# Patient Record
Sex: Male | Born: 1935 | Race: White | Hispanic: No | Marital: Married | Smoking: Former smoker
Health system: Southern US, Community
[De-identification: ages and names within clinical notes are randomized; demographics above are authoritative.]

## PROBLEM LIST (undated history)

## (undated) DIAGNOSIS — I10 Essential (primary) hypertension: Secondary | ICD-10-CM

## (undated) DIAGNOSIS — Z87442 Personal history of urinary calculi: Secondary | ICD-10-CM

## (undated) DIAGNOSIS — M1990 Unspecified osteoarthritis, unspecified site: Secondary | ICD-10-CM

## (undated) DIAGNOSIS — E119 Type 2 diabetes mellitus without complications: Secondary | ICD-10-CM

## (undated) DIAGNOSIS — K219 Gastro-esophageal reflux disease without esophagitis: Secondary | ICD-10-CM

## (undated) DIAGNOSIS — I4891 Unspecified atrial fibrillation: Secondary | ICD-10-CM

## (undated) DIAGNOSIS — E785 Hyperlipidemia, unspecified: Secondary | ICD-10-CM

## (undated) DIAGNOSIS — Q249 Congenital malformation of heart, unspecified: Secondary | ICD-10-CM

## (undated) DIAGNOSIS — K635 Polyp of colon: Secondary | ICD-10-CM

## (undated) DIAGNOSIS — N200 Calculus of kidney: Secondary | ICD-10-CM

## (undated) HISTORY — DX: Type 2 diabetes mellitus without complications: E11.9

## (undated) HISTORY — DX: Gastro-esophageal reflux disease without esophagitis: K21.9

## (undated) HISTORY — DX: Essential (primary) hypertension: I10

## (undated) HISTORY — DX: Polyp of colon: K63.5

## (undated) HISTORY — PX: OTHER SURGICAL HISTORY: SHX169

## (undated) HISTORY — DX: Hyperlipidemia, unspecified: E78.5

## (undated) HISTORY — DX: Unspecified atrial fibrillation: I48.91

## (undated) HISTORY — DX: Calculus of kidney: N20.0

## (undated) HISTORY — DX: Congenital malformation of heart, unspecified: Q24.9

---

## 2006-08-02 ENCOUNTER — Ambulatory Visit: Payer: Self-pay | Admitting: Internal Medicine

## 2006-08-02 LAB — CONVERTED CEMR LAB
BUN: 15 mg/dL (ref 6–23)
Calcium: 9.3 mg/dL (ref 8.4–10.5)
GFR calc Af Amer: 171 mL/min
GFR calc non Af Amer: 141 mL/min
Hgb A1c MFr Bld: 7.2 % — ABNORMAL HIGH (ref 4.6–6.0)
LDL Cholesterol: 69 mg/dL (ref 0–99)
Potassium: 4 meq/L (ref 3.5–5.1)
Total CHOL/HDL Ratio: 2.4
Triglycerides: 62 mg/dL (ref 0–149)
VLDL: 12 mg/dL (ref 0–40)

## 2006-11-09 ENCOUNTER — Ambulatory Visit: Payer: Self-pay | Admitting: Internal Medicine

## 2006-11-09 LAB — CONVERTED CEMR LAB
Calcium: 9 mg/dL (ref 8.4–10.5)
Chloride: 106 meq/L (ref 96–112)
Cholesterol: 144 mg/dL (ref 0–200)
Creatinine, Ser: 0.6 mg/dL (ref 0.4–1.5)
GFR calc non Af Amer: 141 mL/min
Glucose, Bld: 128 mg/dL — ABNORMAL HIGH (ref 70–99)
HDL: 61.1 mg/dL (ref >39.0)
LDL Cholesterol: 68 mg/dL (ref 0–99)
Sodium: 141 meq/L (ref 135–145)

## 2006-11-11 ENCOUNTER — Encounter: Payer: Self-pay | Admitting: Internal Medicine

## 2006-11-11 DIAGNOSIS — J309 Allergic rhinitis, unspecified: Secondary | ICD-10-CM | POA: Insufficient documentation

## 2006-11-11 DIAGNOSIS — I10 Essential (primary) hypertension: Secondary | ICD-10-CM | POA: Insufficient documentation

## 2006-11-11 DIAGNOSIS — Z9189 Other specified personal risk factors, not elsewhere classified: Secondary | ICD-10-CM | POA: Insufficient documentation

## 2006-11-11 DIAGNOSIS — E785 Hyperlipidemia, unspecified: Secondary | ICD-10-CM | POA: Insufficient documentation

## 2006-11-11 DIAGNOSIS — N201 Calculus of ureter: Secondary | ICD-10-CM | POA: Insufficient documentation

## 2006-11-11 DIAGNOSIS — K219 Gastro-esophageal reflux disease without esophagitis: Secondary | ICD-10-CM | POA: Insufficient documentation

## 2006-11-11 DIAGNOSIS — E119 Type 2 diabetes mellitus without complications: Secondary | ICD-10-CM | POA: Insufficient documentation

## 2007-01-21 ENCOUNTER — Ambulatory Visit: Payer: Self-pay | Admitting: Internal Medicine

## 2007-01-21 DIAGNOSIS — J019 Acute sinusitis, unspecified: Secondary | ICD-10-CM | POA: Insufficient documentation

## 2008-11-30 ENCOUNTER — Encounter (INDEPENDENT_AMBULATORY_CARE_PROVIDER_SITE_OTHER): Payer: Self-pay | Admitting: *Deleted

## 2008-11-30 ENCOUNTER — Ambulatory Visit: Payer: Self-pay | Admitting: Gastroenterology

## 2008-11-30 DIAGNOSIS — Z860100 Personal history of colon polyps, unspecified: Secondary | ICD-10-CM | POA: Insufficient documentation

## 2008-11-30 DIAGNOSIS — K921 Melena: Secondary | ICD-10-CM | POA: Insufficient documentation

## 2008-12-22 ENCOUNTER — Ambulatory Visit: Payer: Self-pay | Admitting: Gastroenterology

## 2008-12-22 ENCOUNTER — Encounter: Payer: Self-pay | Admitting: Gastroenterology

## 2008-12-25 ENCOUNTER — Encounter: Payer: Self-pay | Admitting: Gastroenterology

## 2010-03-06 ENCOUNTER — Encounter: Payer: Self-pay | Admitting: Urology

## 2010-05-18 LAB — GLUCOSE, CAPILLARY
Glucose-Capillary: 100 mg/dL — ABNORMAL HIGH (ref 70–99)
Glucose-Capillary: 105 mg/dL — ABNORMAL HIGH (ref 70–99)

## 2010-08-01 ENCOUNTER — Encounter: Payer: Self-pay | Admitting: Physician Assistant

## 2010-08-02 ENCOUNTER — Encounter: Payer: Self-pay | Admitting: Physician Assistant

## 2010-08-02 ENCOUNTER — Ambulatory Visit (INDEPENDENT_AMBULATORY_CARE_PROVIDER_SITE_OTHER): Payer: Self-pay | Admitting: Physician Assistant

## 2010-08-02 DIAGNOSIS — R079 Chest pain, unspecified: Secondary | ICD-10-CM

## 2010-08-02 NOTE — Progress Notes (Signed)
Exercise Treadmill Test  Pre-Exercise Testing Evaluation Rhythm: normal sinus  Rate: 66   PR:  .19 QRS:  .09  QT:  .41 QTc: .43     Test  Exercise Tolerance Test Ordering MD: Jarome Matin M.D  Interpreting MD:  Tereso Newcomer PA-C  Unique Test No: 1  Treadmill:  1  Indication for ETT: chest pain - rule out ischemia  Contraindication to ETT: No   Stress Modality: exercise - treadmill  Cardiac Imaging Performed: non   Protocol: standard Bruce - maximal  Max BP: 205/96  Max MPHR (bpm):  145 85% MPR (bpm):  123  MPHR obtained (bpm):144 % MPHR obtained:  99%  Reached 85% MPHR (min:sec): 4:30 Total Exercise Time (min-sec):  6:30  Workload in METS:  10.9 Borg Scale: 18  Reason ETT Terminated:  patient's desire to stop    ST Segment Analysis At Rest: normal ST segments - no evidence of significant ST depression With Exercise: non-specific ST changes  Other Information Arrhythmia:  No Angina during ETT:  absent (0) Quality of ETT:  diagnostic  ETT Interpretation:  normal - no evidence of ischemia by ST analysis  Comments: Good exercise tolerance. Normal BP response to exercise. No chest pain. No ST-T changes to suggest ischemia.  Recommendations: Follow up with Dr. Eloise Harman.

## 2012-02-26 ENCOUNTER — Ambulatory Visit: Payer: Medicare Other | Attending: Orthopedic Surgery | Admitting: Physical Therapy

## 2012-02-26 ENCOUNTER — Ambulatory Visit: Payer: Self-pay | Admitting: Physical Therapy

## 2012-02-26 DIAGNOSIS — M545 Low back pain, unspecified: Secondary | ICD-10-CM | POA: Insufficient documentation

## 2012-02-26 DIAGNOSIS — IMO0001 Reserved for inherently not codable concepts without codable children: Secondary | ICD-10-CM | POA: Insufficient documentation

## 2012-02-26 DIAGNOSIS — M25559 Pain in unspecified hip: Secondary | ICD-10-CM | POA: Insufficient documentation

## 2012-02-26 DIAGNOSIS — M2569 Stiffness of other specified joint, not elsewhere classified: Secondary | ICD-10-CM | POA: Insufficient documentation

## 2012-02-29 ENCOUNTER — Ambulatory Visit: Payer: Medicare Other | Admitting: Physical Therapy

## 2012-03-04 ENCOUNTER — Ambulatory Visit: Payer: Medicare Other | Admitting: Physical Therapy

## 2012-03-07 ENCOUNTER — Ambulatory Visit: Payer: Medicare Other | Admitting: Physical Therapy

## 2012-03-11 ENCOUNTER — Ambulatory Visit: Payer: Medicare Other | Admitting: Physical Therapy

## 2012-03-14 ENCOUNTER — Ambulatory Visit: Payer: Medicare Other | Admitting: Physical Therapy

## 2012-07-10 ENCOUNTER — Other Ambulatory Visit: Payer: Self-pay | Admitting: Urology

## 2012-07-12 ENCOUNTER — Encounter (HOSPITAL_COMMUNITY): Payer: Self-pay | Admitting: Pharmacy Technician

## 2012-07-24 ENCOUNTER — Encounter (HOSPITAL_COMMUNITY)
Admission: RE | Admit: 2012-07-24 | Discharge: 2012-07-24 | Disposition: A | Discharge status: Home / Self Care | Payer: Medicare Other | Source: Ambulatory Visit | Attending: Urology | Admitting: Urology

## 2012-07-24 ENCOUNTER — Encounter (HOSPITAL_COMMUNITY): Payer: Self-pay

## 2012-07-24 ENCOUNTER — Ambulatory Visit (HOSPITAL_COMMUNITY)
Admission: RE | Admit: 2012-07-24 | Discharge: 2012-07-24 | Disposition: A | Discharge status: Home / Self Care | Payer: Medicare Other | Source: Ambulatory Visit | Attending: Urology | Admitting: Urology

## 2012-07-24 DIAGNOSIS — Z01812 Encounter for preprocedural laboratory examination: Secondary | ICD-10-CM | POA: Insufficient documentation

## 2012-07-24 DIAGNOSIS — Z01810 Encounter for preprocedural cardiovascular examination: Secondary | ICD-10-CM | POA: Insufficient documentation

## 2012-07-24 DIAGNOSIS — Z01818 Encounter for other preprocedural examination: Secondary | ICD-10-CM | POA: Insufficient documentation

## 2012-07-24 DIAGNOSIS — N201 Calculus of ureter: Secondary | ICD-10-CM | POA: Insufficient documentation

## 2012-07-24 HISTORY — DX: Unspecified osteoarthritis, unspecified site: M19.90

## 2012-07-24 HISTORY — DX: Personal history of urinary calculi: Z87.442

## 2012-07-24 LAB — BASIC METABOLIC PANEL
BUN: 15 mg/dL (ref 6–23)
CO2: 27 mEq/L (ref 19–32)
Calcium: 9.5 mg/dL (ref 8.4–10.5)
Chloride: 104 mEq/L (ref 96–112)
Creatinine, Ser: 0.74 mg/dL (ref 0.50–1.35)
GFR calc Af Amer: >90 mL/min (ref >90)
GFR calc non Af Amer: 87 mL/min — ABNORMAL LOW (ref >90)
Glucose, Bld: 182 mg/dL — ABNORMAL HIGH (ref 70–99)
Potassium: 3.9 mEq/L (ref 3.5–5.1)
Sodium: 140 mEq/L (ref 135–145)

## 2012-07-24 LAB — CBC
HCT: 37.8 % — ABNORMAL LOW (ref 39.0–52.0)
Hemoglobin: 13 g/dL (ref 13.0–17.0)
MCH: 29.6 pg (ref 26.0–34.0)
MCHC: 34.4 g/dL (ref 30.0–36.0)
MCV: 86.1 fL (ref 78.0–100.0)
Platelets: 298 10*3/uL (ref 150–400)
RBC: 4.39 MIL/uL (ref 4.22–5.81)
RDW: 13.4 % (ref 11.5–15.5)
WBC: 5 10*3/uL (ref 4.0–10.5)

## 2012-07-24 LAB — SURGICAL PCR SCREEN
MRSA, PCR: NEGATIVE
Staphylococcus aureus: NEGATIVE

## 2012-07-24 NOTE — Patient Instructions (Signed)
YOUR SURGERY IS SCHEDULED AT [facility]  ON:  Monday  June 16  REPORT TO [place] [facility] AT:  8:00 AM      PHONE # FOR SHORT STAY IS [phone]  BRING YOUR BLUE LITHOTRIPSY FOLDER - FOLLOW ALL INSTRUCTIONS GIVEN BY DR. Margrett Rud OFFICE REGARDING PREP ( LAXATIVE DAY BEFORE )  DO NOT EAT OR DRINK ANYTHING AFTER MIDNIGHT THE NIGHT BEFORE YOUR SURGERY.  YOU MAY BRUSH YOUR TEETH, RINSE OUT YOUR MOUTH--BUT NO WATER, NO FOOD, NO CHEWING GUM, NO MINTS, NO CANDIES, NO CHEWING TOBACCO.  PLEASE TAKE THE FOLLOWING MEDICATIONS THE AM OF YOUR SURGERY WITH A FEW SIPS OF WATER:  OMEPRAZOLE, RAPAFLO   IF YOU ARE DIABETIC:  DO NOT TAKE ANY DIABETIC MEDICATIONS THE AM OF YOUR SURGERY.  IF YOU TAKE INSULIN IN THE EVENINGS--PLEASE ONLY TAKE 1/2 NORMAL EVENING DOSE THE NIGHT BEFORE YOUR SURGERY.  NO INSULIN THE AM OF YOUR SURGERY.   DO NOT BRING VALUABLES, MONEY, CREDIT CARDS.  DO NOT WEAR JEWELRY, MAKE-UP, NAIL POLISH AND NO METAL PINS OR CLIPS IN YOUR HAIR. CONTACT LENS, DENTURES / PARTIALS, GLASSES SHOULD NOT BE WORN TO SURGERY AND IN MOST CASES-HEARING AIDS WILL NEED TO BE REMOVED.  BRING YOUR GLASSES CASE, ANY EQUIPMENT NEEDED FOR YOUR CONTACT LENS. FOR PATIENTS ADMITTED TO THE HOSPITAL--CHECK OUT TIME THE DAY OF DISCHARGE IS 11:00 AM.  ALL INPATIENT ROOMS ARE PRIVATE - WITH BATHROOM, TELEPHONE, TELEVISION AND WIFI INTERNET.  IF YOU ARE BEING DISCHARGED THE SAME DAY OF YOUR SURGERY--YOU CAN NOT DRIVE YOURSELF HOME--AND SHOULD NOT GO HOME ALONE BY TAXI OR BUS.  NO DRIVING OR OPERATING MACHINERY FOR 24 HOURS FOLLOWING ANESTHESIA / PAIN MEDICATIONS.  PLEASE MAKE ARRANGEMENTS FOR SOMEONE TO BE WITH YOU AT HOME THE FIRST 24 HOURS AFTER SURGERY. RESPONSIBLE DRIVER'S NAME KATHLEEN - PT'S WIFE                                               PHONE #   [phone]                            PLEASE READ OVER ANY  FACT SHEETS THAT YOU WERE GIVEN: MRSA INFORMATION, BLOOD TRANSFUSION INFORMATION,  INCENTIVE SPIROMETER INFORMATION. FAILURE TO FOLLOW THESE INSTRUCTIONS MAY RESULT IN THE CANCELLATION OF YOUR SURGERY.   PATIENT SIGNATURE_________________________________

## 2012-07-24 NOTE — Pre-Procedure Instructions (Signed)
EKG AND CXR WERE DONE TODAY PREOP AT [facility] AS PER ANESTHESIOLOGIST'S GUIDELINES. 

## 2012-07-28 NOTE — H&P (Signed)
History of Present Illness            Mr. Bonsignore has a history of nephrolithiasis. He has known bilateral renal stones.  He has passed many stones in the past and required ureteroscopic extraction only one time in the distant past.  He has BPH with BOO.  He previously was on Flomax for this.  He stopped Flomax due to dizziness and nausea.  He would rather deal with a slow stream than the side effects of the medication.     Earlier this week he presented with left flank pain.  He was found to have multiple ureteral stones.  He was scheduled for cysto/ureteroscopy and ESWL for 07/29/12.  In the meantime he was going to take Rapaflo to attempt MET of the stones.       Interval history: Mr. Mcpartlin presents today with left flank pain.  He has been pain free until last night.  The pain comes and goes.  He says it seems "lower" and is now radiating to the abdomen.  He took one oxycodone at midnight.  This did not help his pain.  He has also had nausea.  He was not able to fill his zofran prescription as his insurance requested a prior auth which was faxed to [place] this afternoon. He is constipated. He denies vomiting or fever.   Past Medical History Problems  1. History of  Diabetes Mellitus 250.00 2. History of  Heartburn 787.1 3. History of  Hypercholesterolemia 272.0 4. History of  Hypertension 401.9 5. History of  Nephrolithiasis V13.01 6. History of  Ureteral Stone Left 592.1 7. History of  Ureteral Stone Bilateral 592.1  Current Meds 1. Aspirin 81 MG Oral Tablet; Therapy: (Recorded:27May2014) to 2. Crestor 20 MG Oral Tablet; Therapy: (Recorded:27May2014) to 3. Glimepiride 2 MG Oral Tablet; Therapy: 10Mar2014 to 4. Irbesartan 300 MG Oral Tablet; Therapy: 09Dec2013 to 5. MetFORMIN HCl ER 500 MG Oral Tablet Extended Release 24 Hour; Therapy: 06Jan2014 to 6. Omeprazole 10 MG Oral Capsule Delayed Release; Therapy: 06Jan2014 to 7. Oxycodone-Acetaminophen 5-325 MG Oral Tablet; TAKE 1 TABLET EVERY 6  HOURS AS NEEDED  FOR PAIN; Therapy: 27May2014 to (Evaluate:03Jun2014); Last Rx:27May2014 8. Promethazine HCl 12.5 MG Oral Tablet; TAKE 1 TABLET EVERY 4 TO 6 HOURS AS NEEDED FOR  NAUSEA; Therapy: 29May2014 to (Evaluate:02Jun2014)  Requested for: 29May2014; Last  Rx:29May2014 9. Rapaflo 8 MG Oral Capsule; Take 1 capsule by mouth once daily; Therapy: 28May2014 to  (Evaluate:23May2015)  Requested for: 28May2014; Last Rx:28May2014  Allergies Medication  1. No Known Drug Allergies  Family History Problems  1. Paternal history of  Death In The Family Father age 45; ?cancer 2. Maternal history of  Death In The Family Mother age 57; old age 37. Family history of  Family Health Status Number Of Children 1 son; 1 daughter  Social History Problems  1. Alcohol Use 1 1/2 a day 2. Caffeine Use 2 a day 3. Marital History - Currently Married 4. Occupation: retired 5. Tobacco Use V15.82 1 1/2 pk a day for 18 yrs; quit 38 yrs ago  Review of Systems Genitourinary, constitutional, skin, eye, otolaryngeal, hematologic/lymphatic, cardiovascular, pulmonary, endocrine, musculoskeletal, gastrointestinal, neurological and psychiatric system(s) were reviewed and pertinent findings if present are noted.  Genitourinary: urinary frequency, nocturia, weak urinary stream and hematuria.  Respiratory: cough.  Musculoskeletal: upper back pain.   Vitals Vital Signs Blood Pressure: 163 / 89 Temperature: 98 F Heart Rate: 75  Physical Exam Constitutional: Well nourished and well developed. No  acute distress.  ENT: The ears and nose are normal in appearance.  Neck: The appearance of the neck is normal and no neck mass is present.  Pulmonary: No respiratory distress and normal respiratory rhythm and effort.  Cardiovascular: Heart rate and rhythm are normal. No peripheral edema.  Abdomen: The abdomen is soft and nontender. No masses are palpated. No CVA tenderness. No hernias are palpable. No  hepatosplenomegaly noted.  Genitourinary: Examination of the penis demonstrates no discharge, no masses, no lesions and a normal meatus. The penis is circumcised. The scrotum is without lesions. The right epididymis is palpably normal and non-tender. The left epididymis is palpably normal and non-tender. The right testis is non-tender and without masses. The left testis is non-tender and without masses. Rectal exam demonstrates normal sphincter tone, no tenderness and no masses. Estimated prostate size is 2+. The prostate has no nodularity and is not tender. The left seminal vesicle is nonpalpable. The right seminal vesicle is nonpalpable. The perineum is normal on inspection.  Lymphatics: The femoral and inguinal nodes are not enlarged or tender.  Skin: Normal skin turgor, no visible rash and no visible skin lesions.  Neuro/Psych: Mood and affect are appropriate.   Assessment Assessed  1. Mid Ureteral Stone On The Left 592.1   I gave Mr. Barstow ketorolac and phenergan IM to control his pain and nausea.  I also switched his zofran to phenergan.  If his pain continues and is not controlled with medication, he may have to have a stent placed sooner than 6/16 by an on call physician while Dr. Vernie Ammons is on vacation. Hopefully though today was just a bad day and he will have more pain free days like he did the past couple of days.  He will continue Rapaflo and plan to follow up as scheduled, but he knows not to hesitate to call if his symptoms are not controlled and especially if he has a temp of 101 or greater.  He has multiple stones in his left proximal ureter.  We discussed the treatment options and he has elected to proceed with lithotripsy.  Because of the number of stones I have recommended stent placement prior to his ESL.  He understands he may require more than one treatment to completely clear all of his stone burden within his left ureter.  I went over both ESL and cystoscopy with stent  placement in detail with him including the risks, complications and alternatives as well as the probability of success, the outpatient nature of the procedure and the anticipated postoperative course.  He understands and has elected to proceed.    Plan:  1.  He will undergo cystoscopy and left double-J stent placement.   2.  I will then proceed with lithotripsy the same day.

## 2012-07-29 ENCOUNTER — Ambulatory Visit (HOSPITAL_COMMUNITY): Payer: Medicare Other

## 2012-07-29 ENCOUNTER — Encounter (HOSPITAL_COMMUNITY): Payer: Self-pay | Admitting: *Deleted

## 2012-07-29 ENCOUNTER — Encounter (HOSPITAL_COMMUNITY): Payer: Self-pay | Admitting: Anesthesiology

## 2012-07-29 ENCOUNTER — Encounter (HOSPITAL_COMMUNITY)
Admission: RE | Disposition: A | Discharge status: Home / Self Care | Payer: Self-pay | Source: Ambulatory Visit | Attending: Urology

## 2012-07-29 ENCOUNTER — Ambulatory Visit (HOSPITAL_COMMUNITY)
Admission: RE | Admit: 2012-07-29 | Discharge: 2012-07-29 | Disposition: A | Discharge status: Home / Self Care | Payer: Medicare Other | Source: Ambulatory Visit | Attending: Urology | Admitting: Urology

## 2012-07-29 ENCOUNTER — Ambulatory Visit (HOSPITAL_COMMUNITY): Payer: Medicare Other | Admitting: Anesthesiology

## 2012-07-29 DIAGNOSIS — Z7982 Long term (current) use of aspirin: Secondary | ICD-10-CM | POA: Insufficient documentation

## 2012-07-29 DIAGNOSIS — R1110 Vomiting, unspecified: Secondary | ICD-10-CM | POA: Insufficient documentation

## 2012-07-29 DIAGNOSIS — I10 Essential (primary) hypertension: Secondary | ICD-10-CM | POA: Insufficient documentation

## 2012-07-29 DIAGNOSIS — N401 Enlarged prostate with lower urinary tract symptoms: Secondary | ICD-10-CM | POA: Insufficient documentation

## 2012-07-29 DIAGNOSIS — N139 Obstructive and reflux uropathy, unspecified: Secondary | ICD-10-CM | POA: Insufficient documentation

## 2012-07-29 DIAGNOSIS — E119 Type 2 diabetes mellitus without complications: Secondary | ICD-10-CM | POA: Insufficient documentation

## 2012-07-29 DIAGNOSIS — E7800 Pure hypercholesterolemia, unspecified: Secondary | ICD-10-CM | POA: Insufficient documentation

## 2012-07-29 DIAGNOSIS — Z87442 Personal history of urinary calculi: Secondary | ICD-10-CM | POA: Insufficient documentation

## 2012-07-29 DIAGNOSIS — Z79899 Other long term (current) drug therapy: Secondary | ICD-10-CM | POA: Insufficient documentation

## 2012-07-29 DIAGNOSIS — R110 Nausea: Secondary | ICD-10-CM | POA: Insufficient documentation

## 2012-07-29 DIAGNOSIS — N138 Other obstructive and reflux uropathy: Secondary | ICD-10-CM | POA: Insufficient documentation

## 2012-07-29 DIAGNOSIS — N200 Calculus of kidney: Secondary | ICD-10-CM | POA: Insufficient documentation

## 2012-07-29 DIAGNOSIS — K5900 Constipation, unspecified: Secondary | ICD-10-CM | POA: Insufficient documentation

## 2012-07-29 DIAGNOSIS — N201 Calculus of ureter: Secondary | ICD-10-CM | POA: Insufficient documentation

## 2012-07-29 HISTORY — PX: CYSTOSCOPY WITH RETROGRADE PYELOGRAM, URETEROSCOPY AND STENT PLACEMENT: SHX5789

## 2012-07-29 HISTORY — PX: HOLMIUM LASER APPLICATION: SHX5852

## 2012-07-29 LAB — GLUCOSE, CAPILLARY
Glucose-Capillary: 130 mg/dL — ABNORMAL HIGH (ref 70–99)
Glucose-Capillary: 134 mg/dL — ABNORMAL HIGH (ref 70–99)

## 2012-07-29 SURGERY — CYSTOURETEROSCOPY, WITH RETROGRADE PYELOGRAM AND STENT INSERTION
Anesthesia: General | Laterality: Left | Wound class: Clean Contaminated

## 2012-07-29 SURGERY — LITHOTRIPSY, ESWL
Anesthesia: LOCAL

## 2012-07-29 MED ORDER — FENTANYL CITRATE 0.05 MG/ML IJ SOLN
INTRAMUSCULAR | Status: DC | PRN
  Administered 2012-07-29: 50 ug via INTRAVENOUS
  Administered 2012-07-29 (×2): 25 ug via INTRAVENOUS
  Administered 2012-07-29: 100 ug via INTRAVENOUS
  Administered 2012-07-29: 25 ug via INTRAVENOUS

## 2012-07-29 MED ORDER — CIPROFLOXACIN IN D5W 400 MG/200ML IV SOLN
INTRAVENOUS | Status: AC
  Filled 2012-07-29: qty 200

## 2012-07-29 MED ORDER — DIAZEPAM 5 MG PO TABS: 10.0000 mg | ORAL_TABLET | ORAL | Status: DC

## 2012-07-29 MED ORDER — LIDOCAINE HCL 1 % IJ SOLN
INTRAMUSCULAR | Status: DC | PRN
  Administered 2012-07-29: 50 mg via INTRADERMAL

## 2012-07-29 MED ORDER — SODIUM CHLORIDE 0.9 % IR SOLN
Status: DC | PRN
  Administered 2012-07-29: 1000 mL

## 2012-07-29 MED ORDER — PROPOFOL 10 MG/ML IV BOLUS
INTRAVENOUS | Status: DC | PRN
  Administered 2012-07-29: 170 mg via INTRAVENOUS
  Administered 2012-07-29: 30 mg via INTRAVENOUS
  Administered 2012-07-29: 20 mg via INTRAVENOUS

## 2012-07-29 MED ORDER — LIDOCAINE HCL 2 % EX GEL
CUTANEOUS | Status: AC
  Filled 2012-07-29: qty 10

## 2012-07-29 MED ORDER — OXYCODONE-ACETAMINOPHEN 10-325 MG PO TABS: 1.0000 | ORAL_TABLET | ORAL | Status: DC | PRN

## 2012-07-29 MED ORDER — CIPROFLOXACIN IN D5W 400 MG/200ML IV SOLN
400.0000 mg | INTRAVENOUS | Status: AC
  Administered 2012-07-29: 400 mg via INTRAVENOUS

## 2012-07-29 MED ORDER — OXYCODONE HCL 5 MG PO TABS
5.0000 mg | ORAL_TABLET | Freq: Once | ORAL | Status: AC | PRN
  Administered 2012-07-29: 5 mg via ORAL
  Filled 2012-07-29: qty 1

## 2012-07-29 MED ORDER — IOHEXOL 300 MG/ML  SOLN
INTRAMUSCULAR | Status: DC | PRN
  Administered 2012-07-29: 10 mL

## 2012-07-29 MED ORDER — PHENAZOPYRIDINE HCL 200 MG PO TABS
200.0000 mg | ORAL_TABLET | Freq: Three times a day (TID) | ORAL | Status: DC
  Administered 2012-07-29: 200 mg via ORAL
  Filled 2012-07-29 (×2): qty 1

## 2012-07-29 MED ORDER — SODIUM CHLORIDE 0.9 % IV SOLN: INTRAVENOUS | Status: DC

## 2012-07-29 MED ORDER — 0.9 % SODIUM CHLORIDE (POUR BTL) OPTIME
TOPICAL | Status: DC | PRN
  Administered 2012-07-29: 1000 mL

## 2012-07-29 MED ORDER — PHENAZOPYRIDINE HCL 200 MG PO TABS: 200.0000 mg | ORAL_TABLET | Freq: Three times a day (TID) | ORAL | Status: DC | PRN

## 2012-07-29 MED ORDER — LACTATED RINGERS IV SOLN
INTRAVENOUS | Status: DC
  Administered 2012-07-29: 12:00:00 via INTRAVENOUS
  Administered 2012-07-29: 1000 mL via INTRAVENOUS

## 2012-07-29 MED ORDER — DIPHENHYDRAMINE HCL 25 MG PO CAPS: 25.0000 mg | ORAL_CAPSULE | ORAL | Status: DC

## 2012-07-29 MED ORDER — OXYCODONE HCL 5 MG/5ML PO SOLN
5.0000 mg | Freq: Once | ORAL | Status: AC | PRN
  Filled 2012-07-29: qty 5

## 2012-07-29 MED ORDER — IOHEXOL 300 MG/ML  SOLN
INTRAMUSCULAR | Status: AC
  Filled 2012-07-29: qty 1

## 2012-07-29 MED ORDER — SODIUM CHLORIDE 0.9 % IR SOLN
Status: DC | PRN
  Administered 2012-07-29: 3000 mL

## 2012-07-29 SURGICAL SUPPLY — 22 items
ADAPTER CATH URET PLST 4-6FR (CATHETERS) IMPLANT
BAG URO CATCHER STRL LF (DRAPE) ×2 IMPLANT
BASKET ZERO TIP NITINOL 2.4FR (BASKET) ×2 IMPLANT
CATH INTERMIT  6FR 70CM (CATHETERS) ×2 IMPLANT
CATH URET 5FR 28IN OPEN ENDED (CATHETERS) IMPLANT
CLOTH BEACON ORANGE TIMEOUT ST (SAFETY) ×2 IMPLANT
DRAPE CAMERA CLOSED 9X96 (DRAPES) ×2 IMPLANT
FIBER LASER TRAC TIP (UROLOGICAL SUPPLIES) ×2 IMPLANT
GLOVE BIOGEL M 8.0 STRL (GLOVE) ×2 IMPLANT
GLOVE SURG SS PI 8.0 STRL IVOR (GLOVE) ×2 IMPLANT
GOWN PREVENTION PLUS XLARGE (GOWN DISPOSABLE) ×2 IMPLANT
GOWN STRL NON-REIN LRG LVL3 (GOWN DISPOSABLE) ×2 IMPLANT
GOWN STRL REIN XL XLG (GOWN DISPOSABLE) IMPLANT
GUIDEWIRE ANG ZIPWIRE 038X150 (WIRE) IMPLANT
GUIDEWIRE STR DUAL SENSOR (WIRE) ×2 IMPLANT
MANIFOLD NEPTUNE II (INSTRUMENTS) ×2 IMPLANT
MARKER SKIN DUAL TIP RULER LAB (MISCELLANEOUS) IMPLANT
PACK CYSTO (CUSTOM PROCEDURE TRAY) ×2 IMPLANT
SHEATH ACCESS URETERAL 38CM (SHEATH) ×2 IMPLANT
STENT CONTOUR 6FRX24X.038 (STENTS) ×2 IMPLANT
TUBING CONNECTING 10 (TUBING) ×2 IMPLANT
WIRE COONS/BENSON .038X145CM (WIRE) IMPLANT

## 2012-07-29 NOTE — Anesthesia Postprocedure Evaluation (Signed)
Anesthesia Post Note  Patient: Kevin Cole  Procedure(s) Performed: Procedure(s) (LRB): CYSTOSCOPY WITH LEFT RETROGRADE PYELOGRAM, LEFT URETEROSCOPY AND STENT PLACEMENT (Left) HOLMIUM LASER APPLICATION with basket retrival of stone (Left)  Anesthesia type: General  Patient location: PACU  Post pain: Pain level controlled  Post assessment: Post-op Vital signs reviewed  Last Vitals: BP 140/74  Pulse 50  Temp(Src) 36.4 C (Oral)  Resp 14  SpO2 98%  Post vital signs: Reviewed  Level of consciousness: sedated  Complications: No apparent anesthesia complications

## 2012-07-29 NOTE — Interval H&P Note (Signed)
History and Physical Interval Note:  07/29/2012 10:02 AM  Kevin Cole  has presented today for surgery, with the diagnosis of LEFT URETERAL STONE  The various methods of treatment have been discussed with the patient and family. After consideration of risks, benefits and other options for treatment, the patient has consented to  Procedure(s): CYSTOSCOPY WITH LEFT RETROGRADE PYELOGRAM, LEFT URETEROSCOPY AND STENT PLACEMENT, JJ STENT, LITHO (Left) HOLMIUM LASER APPLICATION (Left) as a surgical intervention .  The patient's history has been reviewed, patient examined, no change in status, stable for surgery.  I have reviewed the patient's chart and labs.  Questions were answered to the patient's satisfaction.     Garnett Farm

## 2012-07-29 NOTE — Op Note (Signed)
See [facility] OP note scanned into chart. 

## 2012-07-29 NOTE — Op Note (Signed)
PATIENT:  Kevin Cole  PRE-OPERATIVE DIAGNOSIS:  left Ureteral calculi  POST-OPERATIVE DIAGNOSIS: Same  PROCEDURE:  1. Cystoscopy 2. Left retrograde pyelogram with interpretation 3. Left ureteroscopy and laser lithotripsy 4. Left ureteral stone extraction 5. Left double-J stent placement  SURGEON: Garnett Farm, MD  INDICATION: Mr. Shonk is a 79 year old male with a history of calculus disease. He had known renal calculi but was found to have several stones in his left ureter. There was a very large lead fragment and several other stones proximal to this. It was a very large stone burden. I therefore discussed lithotripsy which would likely require 2 treatments versus the placement of the stent and at that time I would laser the distal stone and then treat the proximal stones with lithotripsy.  ANESTHESIA:  General  EBL:  Minimal  DRAINS: 6 French, 24 cm double-J stent in the left ureter (string left on)  SPECIMEN:  Stone given to patient  DESCRIPTION OF PROCEDURE: The patient was taken to the major OR and placed on the table. General anesthesia was administered and then the patient was moved to the dorsal lithotomy position. The genitalia was sterilely prepped and draped. An official timeout was performed.  Initially the 22 French cystoscope with 12 lens was passed under direct vision down the urethra which was noted be normal. The prostatic urethra revealed bilobar hypertrophy with a slightly high bladder neck.. The bladder was then entered and fully inspected. It was noted be free of any tumors stones or inflammatory lesions. Ureteral orifices were of normal configuration and position. A 6 French open-ended ureteral catheter was then passed through the cystoscope into the ureteral orifice in order to perform a left retrograde pyelogram.  A retrograde pyelogram was performed by injecting full-strength contrast up the left ureter under direct fluoroscopic control. It revealed a  filling defect in the mid ureter consistent with the stone seen on the preoperative KUB. The remainder of the ureter was noted to be normal other than the more proximal stones seen as well. The intrarenal collecting system. I then passed a 0.038 inch floppy-tipped guidewire through the open ended catheter and into the area of the renal pelvis and this was left in place. The inner portion of a ureteral access sheath was then passed over the guidewire to gently dilate the intramural ureter after which I passed the ureteral access sheath and removed the inner portion. I then proceeded with ureteroscopy.  A 6 flexible, digital ureteroscope was then passed under direct vision through the access sheath and up to the most distal stone. The stone was identified and I felt it was too large to extract and therefore elected to proceed with laser lithotripsy. The 200  holmium laser fiber was used to fragment the stone. I then used the nitinol basket to extract all of the stone fragments and reinspection of the ureter ureteroscopically revealed no further stone fragments and no injury to the ureter. I then passed the guidewire through the ureteroscope into the area of the renal pelvis under fluoroscopic control. I removed the ureteroscope leaving the guidewire in place. The access sheath was removed as well. I then backloaded the cystoscope over the guidewire and passed the stent over the guidewire into the area of the renal pelvis. As the guidewire was removed good curl was noted in the renal pelvis. The bladder was drained and the cystoscope was then removed. The patient tolerated the procedure well no intraoperative complications.  PLAN OF CARE: He will recover in  the PACU and then proceed with lithotripsy of the remaining stones this afternoon.  PATIENT DISPOSITION:  PACU - hemodynamically stable.

## 2012-07-29 NOTE — Progress Notes (Signed)
Patient back from PACU at 1250. VSS. String taped to penis. Taken to lithotripsy at 1305.

## 2012-07-29 NOTE — Anesthesia Preprocedure Evaluation (Addendum)
Anesthesia Evaluation  Patient identified by MRN, date of birth, ID band Patient awake    Reviewed: Allergy & Precautions, H&P , NPO status , Patient's Chart, lab work & pertinent test results  Airway Mallampati: II TM Distance: >3 FB Neck ROM: Full    Dental  (+) Dental Advisory Given and Teeth Intact   Pulmonary neg pulmonary ROS,  breath sounds clear to auscultation        Cardiovascular hypertension, Pt. on medications Rhythm:Regular Rate:Normal     Neuro/Psych negative neurological ROS  negative psych ROS   GI/Hepatic Neg liver ROS, GERD-  Medicated,  Endo/Other  negative endocrine ROSdiabetes, Type 2, Oral Hypoglycemic Agents  Renal/GU negative Renal ROS     Musculoskeletal negative musculoskeletal ROS (+)   Abdominal   Peds  Hematology negative hematology ROS (+)   Anesthesia Other Findings   Reproductive/Obstetrics                          Anesthesia Physical Anesthesia Plan  ASA: II  Anesthesia Plan: General   Post-op Pain Management:    Induction: Intravenous  Airway Management Planned: LMA  Additional Equipment:   Intra-op Plan:   Post-operative Plan: Extubation in OR  Informed Consent: I have reviewed the patients History and Physical, chart, labs and discussed the procedure including the risks, benefits and alternatives for the proposed anesthesia with the patient or authorized representative who has indicated his/her understanding and acceptance.   Dental advisory given  Plan Discussed with: CRNA  Anesthesia Plan Comments:         Anesthesia Quick Evaluation

## 2012-07-29 NOTE — Transfer of Care (Signed)
Immediate Anesthesia Transfer of Care Note  Patient: Kevin Cole  Procedure(s) Performed: Procedure(s): CYSTOSCOPY WITH LEFT RETROGRADE PYELOGRAM, LEFT URETEROSCOPY AND STENT PLACEMENT (Left) HOLMIUM LASER APPLICATION with basket retrival of stone (Left)  Patient Location: PACU  Anesthesia Type:General  Level of Consciousness: awake, oriented, patient cooperative and responds to stimulation  Airway & Oxygen Therapy: Patient Spontanous Breathing and Patient connected to face mask oxygen  Post-op Assessment: Report given to PACU RN, Post -op Vital signs reviewed and stable and Patient moving all extremities  Post vital signs: Reviewed and stable  Complications: No apparent anesthesia complications

## 2012-07-29 NOTE — Progress Notes (Signed)
Patient vomited small amount of bile prior to d/c home at 1600. He had recently had pain med and pyridium with a few crackers and drink. Had been up to void twice post lithotripsy with good results. Stated after n/v he "felt better" and he was ready to go home. Pt and wife informed of all d/c instructions. Able to teach back all information and feel comfortable going home and will call MD with any questions or concerns.

## 2012-07-30 ENCOUNTER — Encounter (HOSPITAL_COMMUNITY): Payer: Self-pay | Admitting: Urology

## 2012-07-30 LAB — GLUCOSE, CAPILLARY: Glucose-Capillary: 117 mg/dL — ABNORMAL HIGH (ref 70–99)

## 2012-08-19 ENCOUNTER — Other Ambulatory Visit: Payer: Self-pay | Admitting: Internal Medicine

## 2012-08-19 DIAGNOSIS — R110 Nausea: Secondary | ICD-10-CM

## 2012-08-21 ENCOUNTER — Ambulatory Visit
Admission: RE | Admit: 2012-08-21 | Discharge: 2012-08-21 | Disposition: A | Discharge status: Home / Self Care | Payer: Medicare Other | Source: Ambulatory Visit | Attending: Internal Medicine | Admitting: Internal Medicine

## 2012-08-21 ENCOUNTER — Other Ambulatory Visit: Payer: Self-pay | Admitting: Internal Medicine

## 2012-08-21 DIAGNOSIS — R110 Nausea: Secondary | ICD-10-CM

## 2012-11-05 ENCOUNTER — Encounter: Payer: Self-pay | Admitting: Physician Assistant

## 2014-06-24 ENCOUNTER — Encounter: Payer: Self-pay | Admitting: Gastroenterology

## 2015-03-08 DIAGNOSIS — D2272 Melanocytic nevi of left lower limb, including hip: Secondary | ICD-10-CM | POA: Diagnosis not present

## 2015-03-08 DIAGNOSIS — L570 Actinic keratosis: Secondary | ICD-10-CM | POA: Diagnosis not present

## 2015-03-08 DIAGNOSIS — D1801 Hemangioma of skin and subcutaneous tissue: Secondary | ICD-10-CM | POA: Diagnosis not present

## 2015-03-08 DIAGNOSIS — D2271 Melanocytic nevi of right lower limb, including hip: Secondary | ICD-10-CM | POA: Diagnosis not present

## 2015-03-08 DIAGNOSIS — L821 Other seborrheic keratosis: Secondary | ICD-10-CM | POA: Diagnosis not present

## 2015-03-08 DIAGNOSIS — D2262 Melanocytic nevi of left upper limb, including shoulder: Secondary | ICD-10-CM | POA: Diagnosis not present

## 2015-03-08 DIAGNOSIS — D225 Melanocytic nevi of trunk: Secondary | ICD-10-CM | POA: Diagnosis not present

## 2015-05-03 DIAGNOSIS — K219 Gastro-esophageal reflux disease without esophagitis: Secondary | ICD-10-CM | POA: Diagnosis not present

## 2015-05-03 DIAGNOSIS — Z23 Encounter for immunization: Secondary | ICD-10-CM | POA: Diagnosis not present

## 2015-05-03 DIAGNOSIS — I10 Essential (primary) hypertension: Secondary | ICD-10-CM | POA: Diagnosis not present

## 2015-05-03 DIAGNOSIS — E119 Type 2 diabetes mellitus without complications: Secondary | ICD-10-CM | POA: Diagnosis not present

## 2015-05-03 DIAGNOSIS — Z6825 Body mass index (BMI) 25.0-25.9, adult: Secondary | ICD-10-CM | POA: Diagnosis not present

## 2015-05-03 DIAGNOSIS — E784 Other hyperlipidemia: Secondary | ICD-10-CM | POA: Diagnosis not present

## 2015-06-23 DIAGNOSIS — H2513 Age-related nuclear cataract, bilateral: Secondary | ICD-10-CM | POA: Diagnosis not present

## 2015-06-23 DIAGNOSIS — E119 Type 2 diabetes mellitus without complications: Secondary | ICD-10-CM | POA: Diagnosis not present

## 2015-06-23 DIAGNOSIS — H52203 Unspecified astigmatism, bilateral: Secondary | ICD-10-CM | POA: Diagnosis not present

## 2015-08-03 DIAGNOSIS — C44311 Basal cell carcinoma of skin of nose: Secondary | ICD-10-CM | POA: Diagnosis not present

## 2015-08-03 DIAGNOSIS — D485 Neoplasm of uncertain behavior of skin: Secondary | ICD-10-CM | POA: Diagnosis not present

## 2015-08-19 ENCOUNTER — Encounter (HOSPITAL_COMMUNITY): Payer: Self-pay | Admitting: Urology

## 2015-08-26 ENCOUNTER — Other Ambulatory Visit: Payer: Self-pay | Admitting: Internal Medicine

## 2015-08-26 DIAGNOSIS — R1310 Dysphagia, unspecified: Secondary | ICD-10-CM

## 2015-08-26 DIAGNOSIS — E784 Other hyperlipidemia: Secondary | ICD-10-CM | POA: Diagnosis not present

## 2015-08-26 DIAGNOSIS — K219 Gastro-esophageal reflux disease without esophagitis: Secondary | ICD-10-CM | POA: Diagnosis not present

## 2015-08-26 DIAGNOSIS — Z6826 Body mass index (BMI) 26.0-26.9, adult: Secondary | ICD-10-CM | POA: Diagnosis not present

## 2015-08-26 DIAGNOSIS — J3089 Other allergic rhinitis: Secondary | ICD-10-CM | POA: Diagnosis not present

## 2015-08-26 DIAGNOSIS — R1319 Other dysphagia: Secondary | ICD-10-CM | POA: Diagnosis not present

## 2015-08-26 DIAGNOSIS — E119 Type 2 diabetes mellitus without complications: Secondary | ICD-10-CM | POA: Diagnosis not present

## 2015-08-26 DIAGNOSIS — I10 Essential (primary) hypertension: Secondary | ICD-10-CM | POA: Diagnosis not present

## 2015-08-30 ENCOUNTER — Ambulatory Visit
Admission: RE | Admit: 2015-08-30 | Discharge: 2015-08-30 | Disposition: A | Discharge status: Home / Self Care | Payer: PPO | Source: Ambulatory Visit | Attending: Internal Medicine | Admitting: Internal Medicine

## 2015-08-30 DIAGNOSIS — K219 Gastro-esophageal reflux disease without esophagitis: Secondary | ICD-10-CM | POA: Diagnosis not present

## 2015-08-30 DIAGNOSIS — R1310 Dysphagia, unspecified: Secondary | ICD-10-CM

## 2015-08-30 DIAGNOSIS — N2889 Other specified disorders of kidney and ureter: Secondary | ICD-10-CM | POA: Diagnosis not present

## 2015-08-30 DIAGNOSIS — K449 Diaphragmatic hernia without obstruction or gangrene: Secondary | ICD-10-CM | POA: Diagnosis not present

## 2015-09-02 DIAGNOSIS — C44311 Basal cell carcinoma of skin of nose: Secondary | ICD-10-CM | POA: Diagnosis not present

## 2015-09-02 DIAGNOSIS — Z85828 Personal history of other malignant neoplasm of skin: Secondary | ICD-10-CM | POA: Diagnosis not present

## 2016-01-12 DIAGNOSIS — Z0000 Encounter for general adult medical examination without abnormal findings: Secondary | ICD-10-CM | POA: Diagnosis not present

## 2016-01-12 DIAGNOSIS — N390 Urinary tract infection, site not specified: Secondary | ICD-10-CM | POA: Diagnosis not present

## 2016-01-12 DIAGNOSIS — Z125 Encounter for screening for malignant neoplasm of prostate: Secondary | ICD-10-CM | POA: Diagnosis not present

## 2016-01-12 DIAGNOSIS — R8299 Other abnormal findings in urine: Secondary | ICD-10-CM | POA: Diagnosis not present

## 2016-01-12 DIAGNOSIS — E119 Type 2 diabetes mellitus without complications: Secondary | ICD-10-CM | POA: Diagnosis not present

## 2016-01-12 DIAGNOSIS — I10 Essential (primary) hypertension: Secondary | ICD-10-CM | POA: Diagnosis not present

## 2016-01-18 DIAGNOSIS — Z0000 Encounter for general adult medical examination without abnormal findings: Secondary | ICD-10-CM | POA: Diagnosis not present

## 2016-01-18 DIAGNOSIS — E784 Other hyperlipidemia: Secondary | ICD-10-CM | POA: Diagnosis not present

## 2016-01-18 DIAGNOSIS — I10 Essential (primary) hypertension: Secondary | ICD-10-CM | POA: Diagnosis not present

## 2016-01-18 DIAGNOSIS — Z1389 Encounter for screening for other disorder: Secondary | ICD-10-CM | POA: Diagnosis not present

## 2016-01-18 DIAGNOSIS — Z6826 Body mass index (BMI) 26.0-26.9, adult: Secondary | ICD-10-CM | POA: Diagnosis not present

## 2016-01-18 DIAGNOSIS — E119 Type 2 diabetes mellitus without complications: Secondary | ICD-10-CM | POA: Diagnosis not present

## 2016-01-18 DIAGNOSIS — R972 Elevated prostate specific antigen [PSA]: Secondary | ICD-10-CM | POA: Diagnosis not present

## 2016-01-18 DIAGNOSIS — N200 Calculus of kidney: Secondary | ICD-10-CM | POA: Diagnosis not present

## 2016-03-23 DIAGNOSIS — C44319 Basal cell carcinoma of skin of other parts of face: Secondary | ICD-10-CM | POA: Diagnosis not present

## 2016-03-23 DIAGNOSIS — Z85828 Personal history of other malignant neoplasm of skin: Secondary | ICD-10-CM | POA: Diagnosis not present

## 2016-03-23 DIAGNOSIS — L570 Actinic keratosis: Secondary | ICD-10-CM | POA: Diagnosis not present

## 2016-03-23 DIAGNOSIS — L821 Other seborrheic keratosis: Secondary | ICD-10-CM | POA: Diagnosis not present

## 2016-03-23 DIAGNOSIS — D225 Melanocytic nevi of trunk: Secondary | ICD-10-CM | POA: Diagnosis not present

## 2016-03-23 DIAGNOSIS — D485 Neoplasm of uncertain behavior of skin: Secondary | ICD-10-CM | POA: Diagnosis not present

## 2016-03-23 DIAGNOSIS — C44311 Basal cell carcinoma of skin of nose: Secondary | ICD-10-CM | POA: Diagnosis not present

## 2016-05-22 DIAGNOSIS — I10 Essential (primary) hypertension: Secondary | ICD-10-CM | POA: Diagnosis not present

## 2016-05-22 DIAGNOSIS — Z1389 Encounter for screening for other disorder: Secondary | ICD-10-CM | POA: Diagnosis not present

## 2016-05-22 DIAGNOSIS — E784 Other hyperlipidemia: Secondary | ICD-10-CM | POA: Diagnosis not present

## 2016-05-22 DIAGNOSIS — Z6826 Body mass index (BMI) 26.0-26.9, adult: Secondary | ICD-10-CM | POA: Diagnosis not present

## 2016-05-22 DIAGNOSIS — E119 Type 2 diabetes mellitus without complications: Secondary | ICD-10-CM | POA: Diagnosis not present

## 2016-06-05 DIAGNOSIS — R69 Illness, unspecified: Secondary | ICD-10-CM | POA: Diagnosis not present

## 2016-06-27 DIAGNOSIS — E119 Type 2 diabetes mellitus without complications: Secondary | ICD-10-CM | POA: Diagnosis not present

## 2016-06-27 DIAGNOSIS — H2513 Age-related nuclear cataract, bilateral: Secondary | ICD-10-CM | POA: Diagnosis not present

## 2016-06-27 DIAGNOSIS — H52203 Unspecified astigmatism, bilateral: Secondary | ICD-10-CM | POA: Diagnosis not present

## 2016-09-05 DIAGNOSIS — R69 Illness, unspecified: Secondary | ICD-10-CM | POA: Diagnosis not present

## 2016-10-02 DIAGNOSIS — Z6826 Body mass index (BMI) 26.0-26.9, adult: Secondary | ICD-10-CM | POA: Diagnosis not present

## 2016-10-02 DIAGNOSIS — E119 Type 2 diabetes mellitus without complications: Secondary | ICD-10-CM | POA: Diagnosis not present

## 2016-10-02 DIAGNOSIS — R972 Elevated prostate specific antigen [PSA]: Secondary | ICD-10-CM | POA: Diagnosis not present

## 2016-10-02 DIAGNOSIS — I10 Essential (primary) hypertension: Secondary | ICD-10-CM | POA: Diagnosis not present

## 2016-10-02 DIAGNOSIS — E784 Other hyperlipidemia: Secondary | ICD-10-CM | POA: Diagnosis not present

## 2016-10-02 DIAGNOSIS — H6093 Unspecified otitis externa, bilateral: Secondary | ICD-10-CM | POA: Diagnosis not present

## 2016-12-08 DIAGNOSIS — R69 Illness, unspecified: Secondary | ICD-10-CM | POA: Diagnosis not present

## 2016-12-11 DIAGNOSIS — R69 Illness, unspecified: Secondary | ICD-10-CM | POA: Diagnosis not present

## 2017-01-15 DIAGNOSIS — R82998 Other abnormal findings in urine: Secondary | ICD-10-CM | POA: Diagnosis not present

## 2017-01-15 DIAGNOSIS — I10 Essential (primary) hypertension: Secondary | ICD-10-CM | POA: Diagnosis not present

## 2017-01-15 DIAGNOSIS — E119 Type 2 diabetes mellitus without complications: Secondary | ICD-10-CM | POA: Diagnosis not present

## 2017-01-15 DIAGNOSIS — E7849 Other hyperlipidemia: Secondary | ICD-10-CM | POA: Diagnosis not present

## 2017-01-16 DIAGNOSIS — Z125 Encounter for screening for malignant neoplasm of prostate: Secondary | ICD-10-CM | POA: Diagnosis not present

## 2017-01-29 DIAGNOSIS — E1151 Type 2 diabetes mellitus with diabetic peripheral angiopathy without gangrene: Secondary | ICD-10-CM | POA: Diagnosis not present

## 2017-01-29 DIAGNOSIS — R05 Cough: Secondary | ICD-10-CM | POA: Diagnosis not present

## 2017-01-29 DIAGNOSIS — K219 Gastro-esophageal reflux disease without esophagitis: Secondary | ICD-10-CM | POA: Diagnosis not present

## 2017-01-29 DIAGNOSIS — R972 Elevated prostate specific antigen [PSA]: Secondary | ICD-10-CM | POA: Diagnosis not present

## 2017-01-29 DIAGNOSIS — I10 Essential (primary) hypertension: Secondary | ICD-10-CM | POA: Diagnosis not present

## 2017-01-29 DIAGNOSIS — R200 Anesthesia of skin: Secondary | ICD-10-CM | POA: Diagnosis not present

## 2017-01-29 DIAGNOSIS — J309 Allergic rhinitis, unspecified: Secondary | ICD-10-CM | POA: Diagnosis not present

## 2017-01-29 DIAGNOSIS — E785 Hyperlipidemia, unspecified: Secondary | ICD-10-CM | POA: Diagnosis not present

## 2017-01-29 DIAGNOSIS — Z0000 Encounter for general adult medical examination without abnormal findings: Secondary | ICD-10-CM | POA: Diagnosis not present

## 2017-01-29 DIAGNOSIS — N200 Calculus of kidney: Secondary | ICD-10-CM | POA: Diagnosis not present

## 2017-01-30 DIAGNOSIS — Z1212 Encounter for screening for malignant neoplasm of rectum: Secondary | ICD-10-CM | POA: Diagnosis not present

## 2017-03-19 DIAGNOSIS — R69 Illness, unspecified: Secondary | ICD-10-CM | POA: Diagnosis not present

## 2017-03-27 DIAGNOSIS — D1801 Hemangioma of skin and subcutaneous tissue: Secondary | ICD-10-CM | POA: Diagnosis not present

## 2017-03-27 DIAGNOSIS — L821 Other seborrheic keratosis: Secondary | ICD-10-CM | POA: Diagnosis not present

## 2017-03-27 DIAGNOSIS — L853 Xerosis cutis: Secondary | ICD-10-CM | POA: Diagnosis not present

## 2017-03-27 DIAGNOSIS — L570 Actinic keratosis: Secondary | ICD-10-CM | POA: Diagnosis not present

## 2017-03-27 DIAGNOSIS — L812 Freckles: Secondary | ICD-10-CM | POA: Diagnosis not present

## 2017-03-27 DIAGNOSIS — Z85828 Personal history of other malignant neoplasm of skin: Secondary | ICD-10-CM | POA: Diagnosis not present

## 2017-03-27 DIAGNOSIS — D225 Melanocytic nevi of trunk: Secondary | ICD-10-CM | POA: Diagnosis not present

## 2017-04-06 DIAGNOSIS — Z6824 Body mass index (BMI) 24.0-24.9, adult: Secondary | ICD-10-CM | POA: Diagnosis not present

## 2017-04-06 DIAGNOSIS — I10 Essential (primary) hypertension: Secondary | ICD-10-CM | POA: Diagnosis not present

## 2017-04-06 DIAGNOSIS — Z1389 Encounter for screening for other disorder: Secondary | ICD-10-CM | POA: Diagnosis not present

## 2017-04-06 DIAGNOSIS — E7849 Other hyperlipidemia: Secondary | ICD-10-CM | POA: Diagnosis not present

## 2017-04-06 DIAGNOSIS — E1151 Type 2 diabetes mellitus with diabetic peripheral angiopathy without gangrene: Secondary | ICD-10-CM | POA: Diagnosis not present

## 2017-05-14 DIAGNOSIS — J3489 Other specified disorders of nose and nasal sinuses: Secondary | ICD-10-CM | POA: Diagnosis not present

## 2017-05-14 DIAGNOSIS — Z6824 Body mass index (BMI) 24.0-24.9, adult: Secondary | ICD-10-CM | POA: Diagnosis not present

## 2017-05-14 DIAGNOSIS — I10 Essential (primary) hypertension: Secondary | ICD-10-CM | POA: Diagnosis not present

## 2017-05-14 DIAGNOSIS — E1151 Type 2 diabetes mellitus with diabetic peripheral angiopathy without gangrene: Secondary | ICD-10-CM | POA: Diagnosis not present

## 2017-06-11 DIAGNOSIS — R69 Illness, unspecified: Secondary | ICD-10-CM | POA: Diagnosis not present

## 2017-07-04 DIAGNOSIS — H2513 Age-related nuclear cataract, bilateral: Secondary | ICD-10-CM | POA: Diagnosis not present

## 2017-07-04 DIAGNOSIS — E119 Type 2 diabetes mellitus without complications: Secondary | ICD-10-CM | POA: Diagnosis not present

## 2017-07-04 DIAGNOSIS — H52203 Unspecified astigmatism, bilateral: Secondary | ICD-10-CM | POA: Diagnosis not present

## 2017-07-05 DIAGNOSIS — I10 Essential (primary) hypertension: Secondary | ICD-10-CM | POA: Diagnosis not present

## 2017-07-05 DIAGNOSIS — H6123 Impacted cerumen, bilateral: Secondary | ICD-10-CM | POA: Diagnosis not present

## 2017-07-30 DIAGNOSIS — R69 Illness, unspecified: Secondary | ICD-10-CM | POA: Diagnosis not present

## 2017-08-29 ENCOUNTER — Encounter (HOSPITAL_BASED_OUTPATIENT_CLINIC_OR_DEPARTMENT_OTHER): Payer: Self-pay

## 2017-08-29 ENCOUNTER — Emergency Department (HOSPITAL_BASED_OUTPATIENT_CLINIC_OR_DEPARTMENT_OTHER)
Admission: EM | Admit: 2017-08-29 | Discharge: 2017-08-30 | Disposition: A | Discharge status: Home / Self Care | Payer: Medicare HMO | Attending: Emergency Medicine | Admitting: Emergency Medicine

## 2017-08-29 ENCOUNTER — Other Ambulatory Visit: Payer: Self-pay

## 2017-08-29 DIAGNOSIS — Y93G1 Activity, food preparation and clean up: Secondary | ICD-10-CM | POA: Diagnosis not present

## 2017-08-29 DIAGNOSIS — I10 Essential (primary) hypertension: Secondary | ICD-10-CM | POA: Diagnosis not present

## 2017-08-29 DIAGNOSIS — Z7984 Long term (current) use of oral hypoglycemic drugs: Secondary | ICD-10-CM | POA: Diagnosis not present

## 2017-08-29 DIAGNOSIS — Z87891 Personal history of nicotine dependence: Secondary | ICD-10-CM | POA: Insufficient documentation

## 2017-08-29 DIAGNOSIS — Z79899 Other long term (current) drug therapy: Secondary | ICD-10-CM | POA: Insufficient documentation

## 2017-08-29 DIAGNOSIS — Y999 Unspecified external cause status: Secondary | ICD-10-CM | POA: Diagnosis not present

## 2017-08-29 DIAGNOSIS — E119 Type 2 diabetes mellitus without complications: Secondary | ICD-10-CM | POA: Diagnosis not present

## 2017-08-29 DIAGNOSIS — S61219A Laceration without foreign body of unspecified finger without damage to nail, initial encounter: Secondary | ICD-10-CM

## 2017-08-29 DIAGNOSIS — Y92000 Kitchen of unspecified non-institutional (private) residence as  the place of occurrence of the external cause: Secondary | ICD-10-CM | POA: Diagnosis not present

## 2017-08-29 DIAGNOSIS — W274XXA Contact with kitchen utensil, initial encounter: Secondary | ICD-10-CM | POA: Insufficient documentation

## 2017-08-29 DIAGNOSIS — S61209A Unspecified open wound of unspecified finger without damage to nail, initial encounter: Secondary | ICD-10-CM

## 2017-08-29 DIAGNOSIS — R55 Syncope and collapse: Secondary | ICD-10-CM | POA: Diagnosis not present

## 2017-08-29 DIAGNOSIS — S61012A Laceration without foreign body of left thumb without damage to nail, initial encounter: Secondary | ICD-10-CM | POA: Insufficient documentation

## 2017-08-29 LAB — COMPREHENSIVE METABOLIC PANEL
ALT: 17 U/L (ref 0–44)
AST: 34 U/L (ref 15–41)
Albumin: 4 g/dL (ref 3.5–5.0)
Alkaline Phosphatase: 64 U/L (ref 38–126)
Anion gap: 11 (ref 5–15)
BUN: 23 mg/dL (ref 8–23)
CALCIUM: 9 mg/dL (ref 8.9–10.3)
CHLORIDE: 103 mmol/L (ref 98–111)
CO2: 22 mmol/L (ref 22–32)
CREATININE: 0.89 mg/dL (ref 0.61–1.24)
GFR calc Af Amer: >60 mL/min (ref >60)
GFR calc non Af Amer: >60 mL/min (ref >60)
Glucose, Bld: 141 mg/dL — ABNORMAL HIGH (ref 70–99)
Potassium: 3.7 mmol/L (ref 3.5–5.1)
Sodium: 136 mmol/L (ref 135–145)
Total Bilirubin: 0.6 mg/dL (ref 0.3–1.2)
Total Protein: 7.2 g/dL (ref 6.5–8.1)

## 2017-08-29 LAB — CBC WITH DIFFERENTIAL/PLATELET
Basophils Absolute: 0.1 10*3/uL (ref 0.0–0.1)
Basophils Relative: 1 %
EOS PCT: 5 %
Eosinophils Absolute: 0.3 10*3/uL (ref 0.0–0.7)
HCT: 36.3 % — ABNORMAL LOW (ref 39.0–52.0)
Hemoglobin: 11.9 g/dL — ABNORMAL LOW (ref 13.0–17.0)
LYMPHS PCT: 29 %
Lymphs Abs: 1.8 10*3/uL (ref 0.7–4.0)
MCH: 27.9 pg (ref 26.0–34.0)
MCHC: 32.8 g/dL (ref 30.0–36.0)
MCV: 85.2 fL (ref 78.0–100.0)
MONO ABS: 0.8 10*3/uL (ref 0.1–1.0)
MONOS PCT: 12 %
Neutro Abs: 3.3 10*3/uL (ref 1.7–7.7)
Neutrophils Relative %: 53 %
PLATELETS: 211 10*3/uL (ref 150–400)
RBC: 4.26 MIL/uL (ref 4.22–5.81)
RDW: 14.4 % (ref 11.5–15.5)
WBC: 6.2 10*3/uL (ref 4.0–10.5)

## 2017-08-29 LAB — CBG MONITORING, ED: Glucose-Capillary: 139 mg/dL — ABNORMAL HIGH (ref 70–99)

## 2017-08-29 LAB — TROPONIN I

## 2017-08-29 MED ORDER — SODIUM CHLORIDE 0.9 % IV BOLUS
500.0000 mL | Freq: Once | INTRAVENOUS | Status: AC
  Administered 2017-08-29: 500 mL via INTRAVENOUS

## 2017-08-29 NOTE — ED Triage Notes (Signed)
Pt states he peeled skin off left thumb with vegetable peeler approx 7pm-continues to bleed-NAD-steady gait

## 2017-08-29 NOTE — ED Notes (Signed)
Pt became pale and diaphoretic, and was not responding verbally despite eyes being open, while EMT was performing wound care.  Pt moved to 14, EKG, VS and FSBG obtained, EDP notified

## 2017-08-29 NOTE — Discharge Instructions (Addendum)
Please follow up with your primary care provider within 5-7 days for re-evaluation of your symptoms.  Please return to the emergency room immediately if you experience any new or worsening symptoms or any symptoms that indicate worsening infection such as fevers, increased redness/swelling/pain, warmth, or drainage from the affected area.

## 2017-08-29 NOTE — ED Notes (Signed)
Patient states " I feel fine now"; denies any dizziness or lightheadedness.

## 2017-08-29 NOTE — ED Provider Notes (Addendum)
[facility] DEPARTMENT Provider Note   CSN: [id number] Arrival date & time: 08/29/17  2149     History   Chief Complaint Chief Complaint  Patient presents with  . Finger Injury    [name] is a 84 y.o. male.  HPI   Pt Is an 84 year old male presenting the emergency department today for evaluation of the left thumb laceration occurred prior to arrival.  [name]  Had sudden onset of pain has been constant since onset.  Exacerbated with palpation and movement.  No other exacerbating or alleviating factors.  States that his Tdap is up-to-date.  [name]  Past Medical History:  Diagnosis Date  . Allergic rhinitis   . Arthritis    LUMBAR - RECENT PHYSICAL THERAPY -WAS HAVING LOWER BACK PAIN  . Colon polyps    type unknown  . Diabetes mellitus, type 2 ([place])   . GERD (gastroesophageal reflux disease)   . Hemorrhoids   . History of kidney stones   . Hyperlipidemia   . Hypertension     Patient Active Problem List   Diagnosis Date Noted  . BLOOD IN STOOL 11/30/2008  . PERSONAL HX COLONIC POLYPS 11/30/2008  . SINUSITIS- ACUTE-NOS 01/21/2007  . DIABETES MELLITUS, TYPE II 11/11/2006  . HYPERLIPIDEMIA 11/11/2006  . HYPERTENSION 11/11/2006  . ALLERGIC RHINITIS 11/11/2006  . GERD 11/11/2006  . Ureteral calculus, left 11/11/2006  . CHICKENPOX, HX OF 11/11/2006    Past Surgical History:  Procedure Laterality Date  . CYSTO FOR BLADDER STONE    . CYSTOSCOPY WITH RETROGRADE PYELOGRAM, URETEROSCOPY AND STENT PLACEMENT Left 07/29/2012   Procedure: CYSTOSCOPY WITH LEFT RETROGRADE PYELOGRAM, LEFT URETEROSCOPY AND STENT PLACEMENT;  Surgeon: Claybon Jabs, MD;  Location: [facility];  Service: Urology;  Laterality: Left;  . HOLMIUM LASER APPLICATION Left 5/32/9924   Procedure: HOLMIUM LASER APPLICATION with basket retrival of stone;  Surgeon: Claybon Jabs, MD;  Location: [facility];  Service: Urology;  Laterality: Left;        Home Medications    Prior to Admission medications   Medication Sig Start Date End Date Taking? Authorizing Provider  ascorbic acid (VITAMIN C) 500 MG tablet Take 500 mg by mouth daily.      [provider]  aspirin EC 81 MG tablet Take 81 mg by mouth every evening.    [provider]  co-enzyme Q-10 50 MG capsule Take 50 mg by mouth daily.     [provider]  Flaxseed, Linseed, (FLAX SEED OIL) 1000 MG CAPS Take 1 capsule by mouth 2 (two) times daily after a meal.     [provider]  glimepiride (AMARYL) 2 MG tablet Take 1 mg by mouth daily before breakfast. Takes 1/2 tablet    [provider]  hydrocortisone (ANUSOL-HC) 2.5 % rectal cream Place 1 application rectally 2 (two) times daily as needed for hemorrhoids.     [provider]  irbesartan (AVAPRO) 300 MG tablet Take 300 mg by mouth every morning.    [provider]  metFORMIN (GLUCOPHAGE) 500 MG tablet Take 1,000 mg by mouth 2 (two) times daily with a meal.     [provider]  Multiple Vitamin (MULTIVITAMIN) tablet Take 1 tablet by mouth daily.     [provider]  omeprazole (PRILOSEC) 10 MG capsule Take 10 mg by mouth daily.  [provider]  oxyCODONE-acetaminophen (PERCOCET) 10-325 MG per tablet Take 1 tablet by mouth every 4 (four) hours as needed for pain. 07/29/12   Kathie Rhodes, MD  phenazopyridine (PYRIDIUM) 200 MG tablet Take 1 tablet (200 mg total) by mouth 3 (three) times daily as needed for pain. 07/29/12   Kathie Rhodes, MD  rosuvastatin (CRESTOR) 20 MG tablet Take 20 mg by mouth every evening.     [provider]  Saw Palmetto 450 MG CAPS Take 1 capsule by mouth 2 (two) times daily.     [provider]  silodosin (RAPAFLO) 8 MG CAPS capsule Take 8 mg by mouth daily with breakfast.    [provider]    Family  History Family History  Problem Relation Age of Onset  . Lung cancer Unknown   . Alcohol abuse Unknown   . Colon cancer Neg Hx     Social History Social History   Tobacco Use  . Smoking status: Former Research scientist (life sciences)  . Smokeless tobacco: Never Used  Substance Use Topics  . Alcohol use: Yes    Comment: daily  . Drug use: No     Allergies   [name]   Review of Systems Review of Systems  Constitutional: Negative for fever.  Musculoskeletal:       Finger pain  Skin: Positive for wound.  Neurological: Negative for weakness and numbness.     Physical Exam Updated Vital Signs BP (!) 142/68   Pulse 63   Temp 97.9 F (36.6 C) (Oral)   Resp 12   Ht 5\' 10"  (1.778 m)   Wt 75.5 kg (166 lb 6.4 oz)   SpO2 96%   BMI 23.88 kg/m   Physical Exam  Constitutional: [name], place, and time. [name] No distress.  Eyes: Conjunctivae are normal.  Cardiovascular: Normal rate.  Pulmonary/Chest: Effort normal.  Musculoskeletal:  Small superficial wound to the left distal thumb. No FB noted. No laceration that would require suturing. No nailbed involvement. Distal sensation intact. Normal ROM.   Neurological: [name], place, and time.  Skin: Skin is warm and dry.    ED Treatments / Results  Labs (all labs ordered are listed, but only abnormal results are displayed) Labs Reviewed  CBC WITH DIFFERENTIAL/PLATELET - Abnormal; Notable for the following components:      Result Value   Hemoglobin 11.9 (*)    HCT 36.3 (*)    All other components within normal limits  COMPREHENSIVE METABOLIC PANEL - Abnormal; Notable for the following components:   Glucose, Bld 141 (*)    All other components within normal limits  CBG MONITORING, ED - Abnormal; Notable for the following components:   Glucose-Capillary 139 (*)    All other components within normal limits  TROPONIN I    EKG EKG  Interpretation  Date/Time:  Wednesday August 29 2017 23:26:31 EDT Ventricular Rate:  47 PR Interval:    QRS Duration: 105 QT Interval:  482 QTC Calculation: 427 R Axis:   -2 Text Interpretation:  Sinus bradycardia Atrial premature complex Abnormal R-wave progression, early transition No significant change since last tracing Confirmed by Isla Pence [phone]) on 08/29/2017 11:28:30 PM   Radiology No results found.  Procedures Procedures (including critical care time)  Medications Ordered in ED Medications  sodium chloride 0.9 % bolus 500 mL (0 mLs Intravenous Stopped 08/30/17 0000)     Initial Impression / Assessment  and Plan / ED Course  I have reviewed the triage vital signs and the nursing notes.  Pertinent labs & imaging results that were available during my care of the patient were reviewed by me and considered in my medical decision making (see chart for details).   10:46 PM upon discharge pt had a syncopal episode. He became diaphoretic. He states that he does not normally pass out at the site of blood. Denies chest pain or shortness of breath. Dr Gilford Raid at bedside for eval. Will order basic labs, ekg, and trop.   Dr. Gilford Raid reviewed labs and EKG. She states patient is likely safe for discharge is he is feeling improved and back to baseline.  Final Clinical Impressions(s) / ED Diagnoses   Final diagnoses:  Cut of finger  Vasovagal episode   Pt with wound to left distal thumb. Wound not amenable to laceration repair.  Tdap UTD. Wound cleansed with betadine and surclens. Bacitracin applied and wound care applied. Wound care instructions discussed. Advised pcp f/u and return if signs of infection. All questions answered and pt understands plan.   Upon discharging the patient, he had a near syncopal episode.  He became pale and diaphoretic.  EKG was obtained as well as basic labs and a troponin.  Patient was given a fluid bolus of 500 mL.  He denied any chest pain,  shortness of breath, palpitations throughout the episode or afterwards.    On re-evaluation, he states that he feels improved after fluids and feels back to normal.  He no longer appears pale or diaphoretic.  He is alert and oriented.  Denies any chest pain or shortness of breath.  His lab work is grossly benign.  He has mild anemia.  No leukocytosis.  CMP with normal kidney function and liver function.  Normal electrolytes.  Troponin negative.  ECG completed showed sinus bradycardia, unchanged from prior.  Suspect that patient had vasovagal episode.  he is completely back to baseline.  Feel that he is safe for discharge with close outpatient follow-up.  Gave strict return precautions for any new or worsening symptoms or recurrence of syncope.  He agrees with the plan for follow-up and understands return precautions.  All questions answered.   ED Discharge Orders    None       Rodney Booze, PA-C 08/29/17 2236    Rodney Booze, PA-C 08/30/17 0038    Isla Pence, MD 08/30/17 1701

## 2017-09-14 DIAGNOSIS — E7849 Other hyperlipidemia: Secondary | ICD-10-CM | POA: Diagnosis not present

## 2017-09-14 DIAGNOSIS — E1151 Type 2 diabetes mellitus with diabetic peripheral angiopathy without gangrene: Secondary | ICD-10-CM | POA: Diagnosis not present

## 2017-09-14 DIAGNOSIS — R200 Anesthesia of skin: Secondary | ICD-10-CM | POA: Diagnosis not present

## 2017-09-14 DIAGNOSIS — I10 Essential (primary) hypertension: Secondary | ICD-10-CM | POA: Diagnosis not present

## 2017-09-14 DIAGNOSIS — R55 Syncope and collapse: Secondary | ICD-10-CM | POA: Diagnosis not present

## 2017-09-14 DIAGNOSIS — R972 Elevated prostate specific antigen [PSA]: Secondary | ICD-10-CM | POA: Diagnosis not present

## 2017-09-14 DIAGNOSIS — Z6824 Body mass index (BMI) 24.0-24.9, adult: Secondary | ICD-10-CM | POA: Diagnosis not present

## 2017-10-29 IMAGING — RF DG ESOPHAGUS
19 of 24 series · 19 of 24 positions shown · non-contrast
Comparison: None.

CLINICAL DATA: Intermittent dysphagia for 6 months. History of
diabetes and gastroesophageal reflux disease.

EXAM:
ESOPHOGRAM / BARIUM SWALLOW / BARIUM TABLET STUDY
TECHNIQUE: Combined double contrast and single contrast examination performed
using effervescent crystals, thick barium liquid, and thin barium
liquid. The patient was observed with fluoroscopy swallowing a 13 mm
barium sulphate tablet.

[Series 1: run · 1 of 2 slices shown (1 of 19)]
[im 1/2]
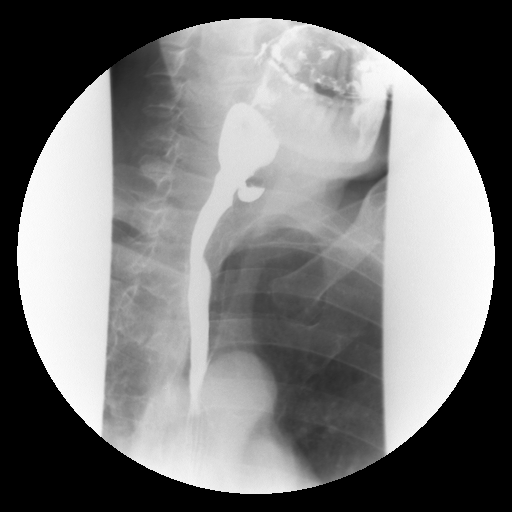

[Series 2: run · 1 of 2 slices shown (2 of 19)]
[im 1/2]
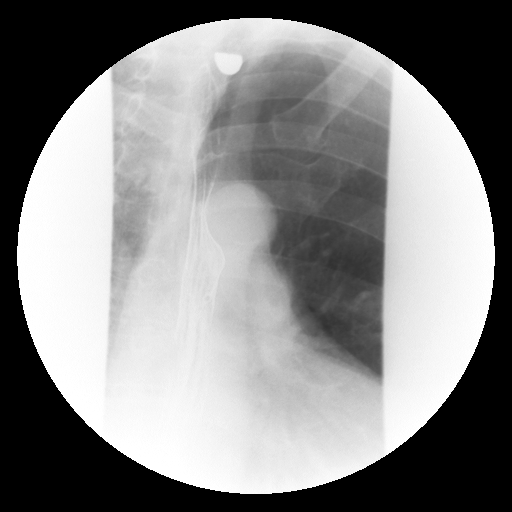

[Series 4: run · 1 of 2 slices shown (3 of 19)]
[im 1/2]
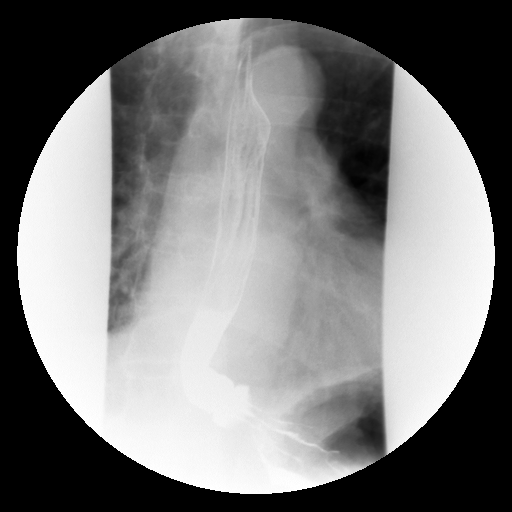

[Series 5: run · 1 of 2 slices shown (4 of 19)]
[im 1/2]
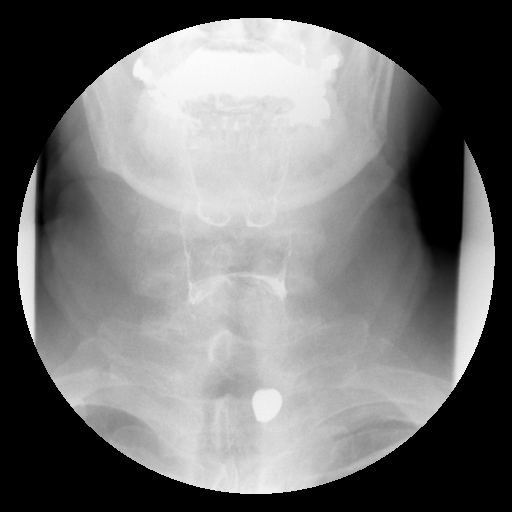

[Series 6: run · 1 of 16 slices shown (5 of 19)]
[im 1/16]
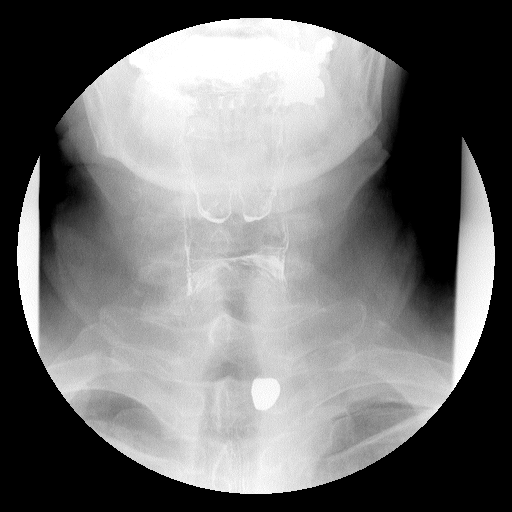

[Series 7: run · 1 of 10 slices shown (6 of 19)]
[im 1/10]
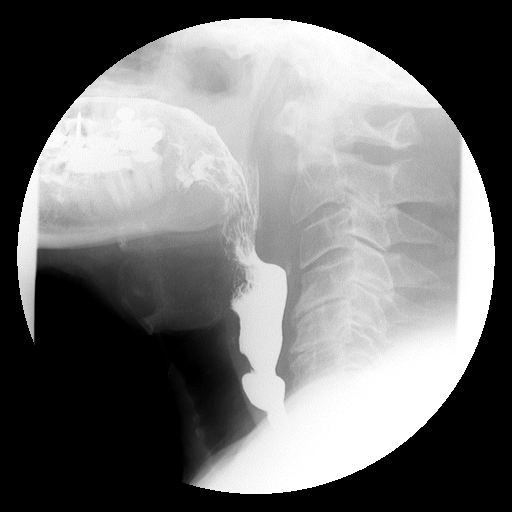

[Series 9: run · 1 of 2 slices shown (7 of 19)]
[im 1/2]
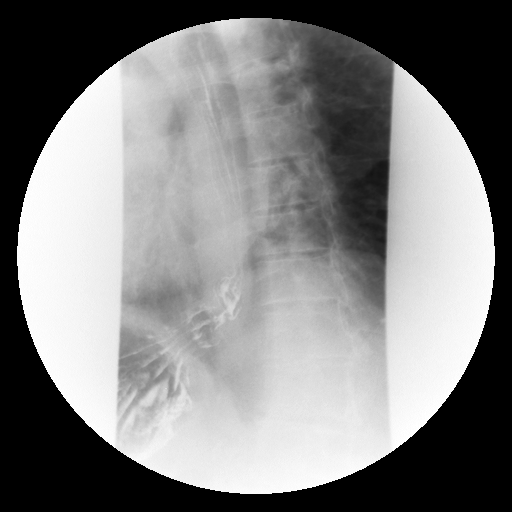

[Series 10: run · 1 of 2 slices shown (8 of 19)]
[im 1/2]
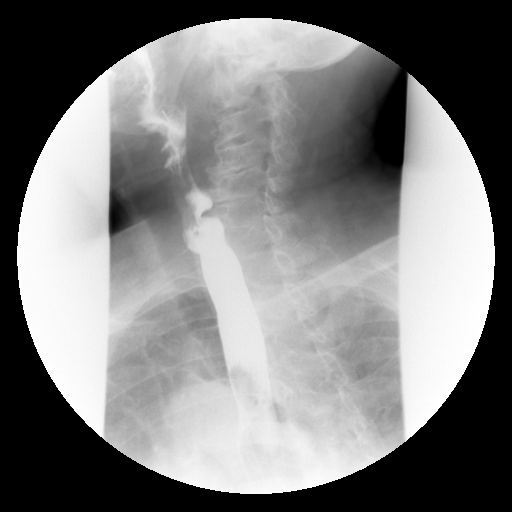

[Series 11: run · 1 of 2 slices shown (9 of 19)]
[im 1/2]
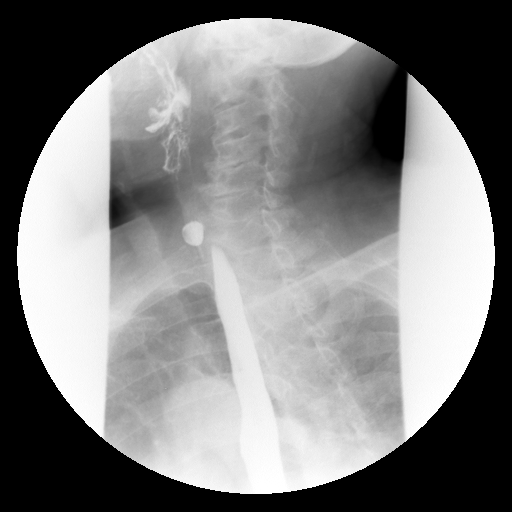

[Series 13: run · 1 of 2 slices shown (10 of 19)]
[im 1/2]
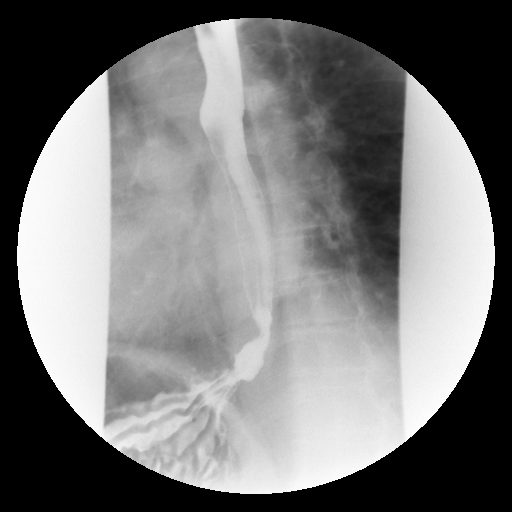

[Series 14: run · 1 of 2 slices shown (11 of 19)]
[im 1/2]
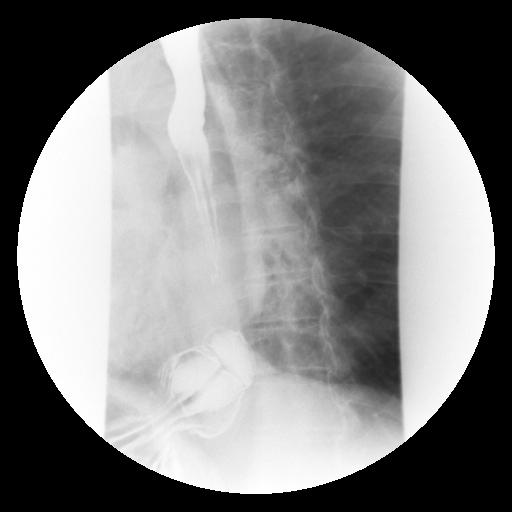

[Series 15: run · 1 of 2 slices shown (12 of 19)]
[im 1/2]
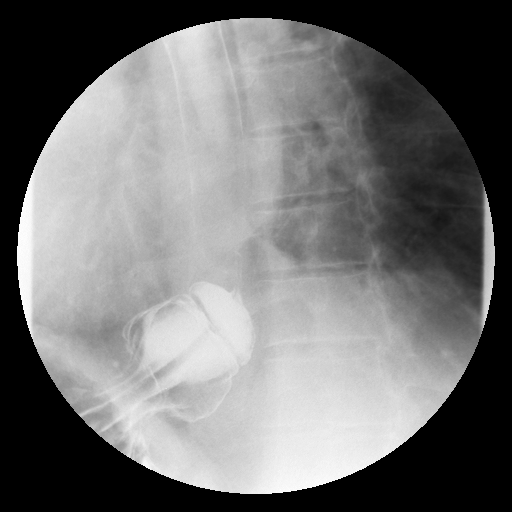

[Series 16: run · 1 of 2 slices shown (13 of 19)]
[im 1/2]
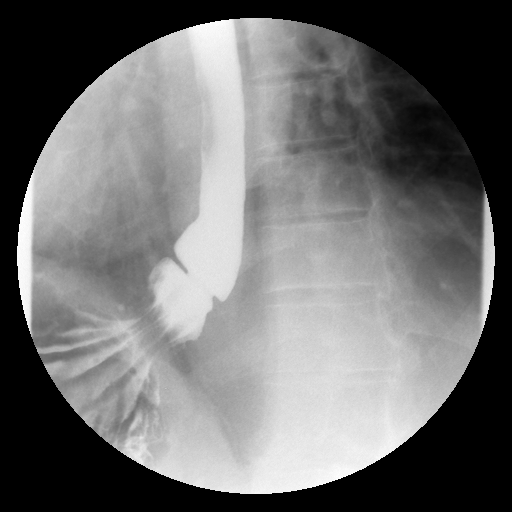

[Series 18: run · 1 of 2 slices shown (14 of 19)]
[im 1/2]
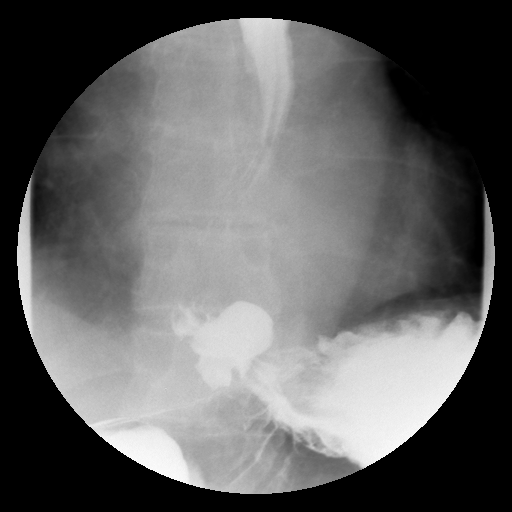

[Series 19: run · 1 of 2 slices shown (15 of 19)]
[im 1/2]
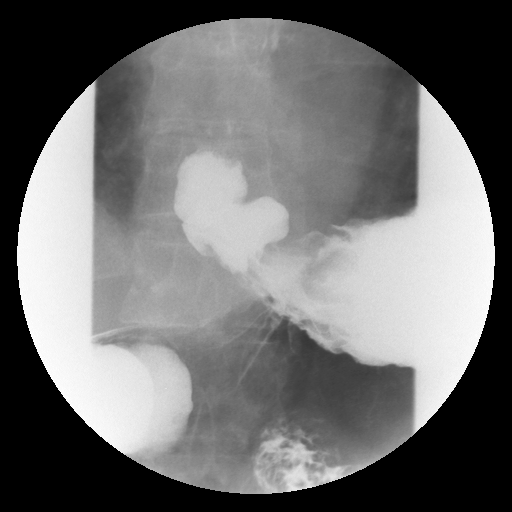

[Series 20: run · 1 of 2 slices shown (16 of 19)]
[im 1/2]
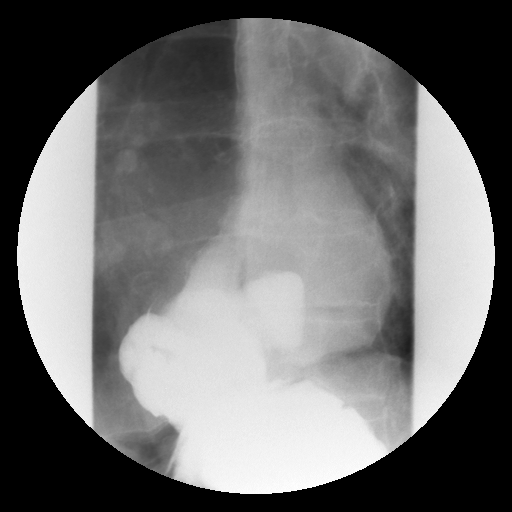

[Series 21: run · 1 of 2 slices shown (17 of 19)]
[im 1/2]
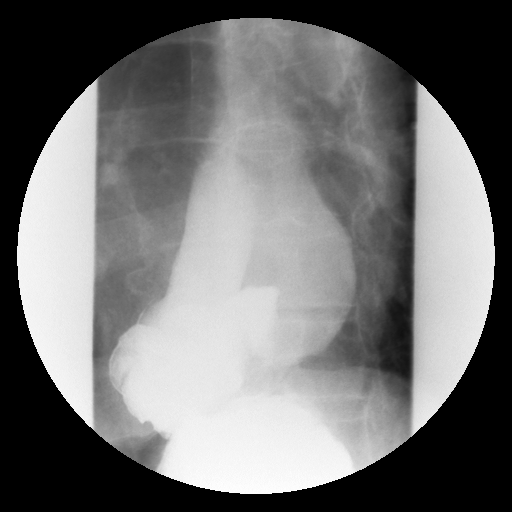

[Series 23: run · 1 of 2 slices shown (18 of 19)]
[im 1/2]
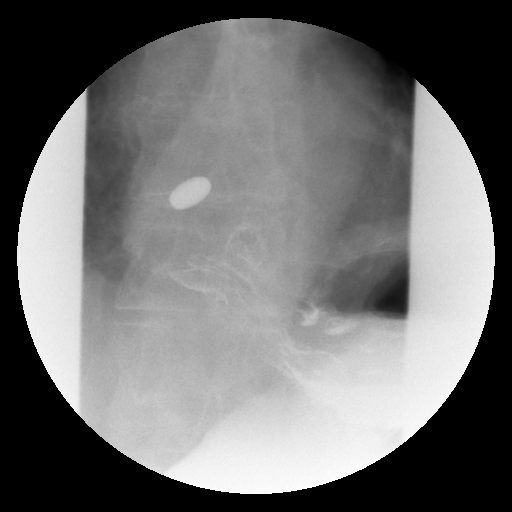

[Series 24: run · 1 of 2 slices shown (19 of 19)]
[im 1/2]
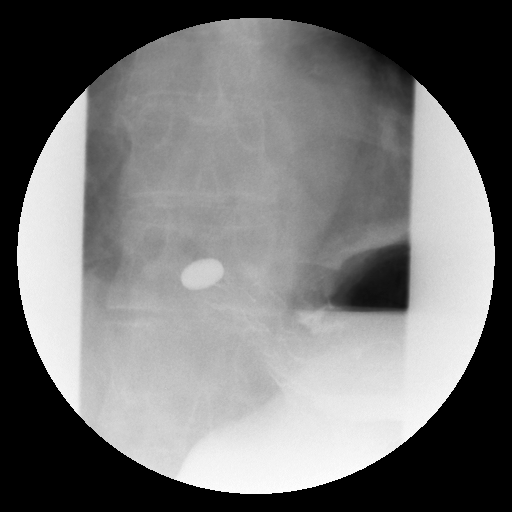

[19 of 24 positions shown; findings below may reference images not displayed]

FLUOROSCOPY TIME:  Radiation Exposure Index (as provided by the
fluoroscopic device): 24 dRycmC

If the device does not provide the exposure index:

Fluoroscopy Time:  1 minutes and 54 seconds of pulsed fluoro

Number of Acquired Images: 2 rapid sequence series of the cervical
esophagus (13 images total). Remainder of study obtained with fluoro
store.
FINDINGS: The esophageal motility is normal. Rapid sequence imaging of the
pharynx in the AP and lateral projections demonstrates no laryngeal
penetration. There is a small left-sided Tha diverticulum
without associated internal debris or wall irregularity.

There is a small reducible hiatal hernia with an associated non
restrictive distal esophageal ring. No esophageal ulceration,
stricture or mass is identified. There is gastroesophageal reflux to
the level of the carina with the water siphon test.

A 13 mm barium tablet was administered and was temporally delayed at
the gastroesophageal junction. After approximately 2 minutes, the
tablet passed into the stomach.
IMPRESSION: 1. Small left-sided Tha diverticulum.
2. Reducible hiatal hernia with non restrictive distal esophageal
ring. A barium tablet was temporarily delayed at the GE junction,
although did pass into the stomach.
3. Moderate gastroesophageal reflux.

## 2017-11-08 DIAGNOSIS — Z23 Encounter for immunization: Secondary | ICD-10-CM | POA: Diagnosis not present

## 2017-12-19 DIAGNOSIS — R69 Illness, unspecified: Secondary | ICD-10-CM | POA: Diagnosis not present

## 2017-12-31 DIAGNOSIS — Z85828 Personal history of other malignant neoplasm of skin: Secondary | ICD-10-CM | POA: Diagnosis not present

## 2017-12-31 DIAGNOSIS — L570 Actinic keratosis: Secondary | ICD-10-CM | POA: Diagnosis not present

## 2017-12-31 DIAGNOSIS — D485 Neoplasm of uncertain behavior of skin: Secondary | ICD-10-CM | POA: Diagnosis not present

## 2018-01-31 DIAGNOSIS — I10 Essential (primary) hypertension: Secondary | ICD-10-CM | POA: Diagnosis not present

## 2018-01-31 DIAGNOSIS — Z6824 Body mass index (BMI) 24.0-24.9, adult: Secondary | ICD-10-CM | POA: Diagnosis not present

## 2018-01-31 DIAGNOSIS — E1151 Type 2 diabetes mellitus with diabetic peripheral angiopathy without gangrene: Secondary | ICD-10-CM | POA: Diagnosis not present

## 2018-01-31 DIAGNOSIS — E7849 Other hyperlipidemia: Secondary | ICD-10-CM | POA: Diagnosis not present

## 2018-03-22 DIAGNOSIS — D1801 Hemangioma of skin and subcutaneous tissue: Secondary | ICD-10-CM | POA: Diagnosis not present

## 2018-03-22 DIAGNOSIS — L812 Freckles: Secondary | ICD-10-CM | POA: Diagnosis not present

## 2018-03-22 DIAGNOSIS — L821 Other seborrheic keratosis: Secondary | ICD-10-CM | POA: Diagnosis not present

## 2018-03-22 DIAGNOSIS — Z85828 Personal history of other malignant neoplasm of skin: Secondary | ICD-10-CM | POA: Diagnosis not present

## 2018-05-20 DIAGNOSIS — Z1331 Encounter for screening for depression: Secondary | ICD-10-CM | POA: Diagnosis not present

## 2018-05-20 DIAGNOSIS — I10 Essential (primary) hypertension: Secondary | ICD-10-CM | POA: Diagnosis not present

## 2018-05-20 DIAGNOSIS — E1151 Type 2 diabetes mellitus with diabetic peripheral angiopathy without gangrene: Secondary | ICD-10-CM | POA: Diagnosis not present

## 2018-05-20 DIAGNOSIS — E785 Hyperlipidemia, unspecified: Secondary | ICD-10-CM | POA: Diagnosis not present

## 2018-07-10 DIAGNOSIS — E119 Type 2 diabetes mellitus without complications: Secondary | ICD-10-CM | POA: Diagnosis not present

## 2018-07-10 DIAGNOSIS — H52203 Unspecified astigmatism, bilateral: Secondary | ICD-10-CM | POA: Diagnosis not present

## 2018-07-10 DIAGNOSIS — H2513 Age-related nuclear cataract, bilateral: Secondary | ICD-10-CM | POA: Diagnosis not present

## 2018-07-29 DIAGNOSIS — I10 Essential (primary) hypertension: Secondary | ICD-10-CM | POA: Diagnosis not present

## 2018-07-29 DIAGNOSIS — E1151 Type 2 diabetes mellitus with diabetic peripheral angiopathy without gangrene: Secondary | ICD-10-CM | POA: Diagnosis not present

## 2018-07-29 DIAGNOSIS — M545 Low back pain: Secondary | ICD-10-CM | POA: Diagnosis not present

## 2018-07-30 DIAGNOSIS — M9904 Segmental and somatic dysfunction of sacral region: Secondary | ICD-10-CM | POA: Diagnosis not present

## 2018-07-30 DIAGNOSIS — M9902 Segmental and somatic dysfunction of thoracic region: Secondary | ICD-10-CM | POA: Diagnosis not present

## 2018-08-15 DIAGNOSIS — M48062 Spinal stenosis, lumbar region with neurogenic claudication: Secondary | ICD-10-CM | POA: Diagnosis not present

## 2018-08-15 DIAGNOSIS — M545 Low back pain: Secondary | ICD-10-CM | POA: Diagnosis not present

## 2018-08-15 DIAGNOSIS — M25551 Pain in right hip: Secondary | ICD-10-CM | POA: Diagnosis not present

## 2018-08-15 DIAGNOSIS — M5416 Radiculopathy, lumbar region: Secondary | ICD-10-CM | POA: Diagnosis not present

## 2018-08-22 DIAGNOSIS — M9902 Segmental and somatic dysfunction of thoracic region: Secondary | ICD-10-CM | POA: Diagnosis not present

## 2018-08-22 DIAGNOSIS — M9904 Segmental and somatic dysfunction of sacral region: Secondary | ICD-10-CM | POA: Diagnosis not present

## 2018-08-24 DIAGNOSIS — M545 Low back pain: Secondary | ICD-10-CM | POA: Diagnosis not present

## 2018-08-26 ENCOUNTER — Encounter: Payer: Medicare HMO | Admitting: Physical Medicine and Rehabilitation

## 2018-08-27 ENCOUNTER — Telehealth: Payer: Self-pay | Admitting: *Deleted

## 2018-08-27 NOTE — Telephone Encounter (Signed)
Pt called to cancel appt for 08/26/2018.

## 2018-08-30 DIAGNOSIS — M48062 Spinal stenosis, lumbar region with neurogenic claudication: Secondary | ICD-10-CM | POA: Diagnosis not present

## 2018-08-30 DIAGNOSIS — M5136 Other intervertebral disc degeneration, lumbar region: Secondary | ICD-10-CM | POA: Diagnosis not present

## 2018-09-03 DIAGNOSIS — R69 Illness, unspecified: Secondary | ICD-10-CM | POA: Diagnosis not present

## 2018-09-17 DIAGNOSIS — J342 Deviated nasal septum: Secondary | ICD-10-CM | POA: Diagnosis not present

## 2018-09-17 DIAGNOSIS — J302 Other seasonal allergic rhinitis: Secondary | ICD-10-CM | POA: Diagnosis not present

## 2018-09-19 DIAGNOSIS — E785 Hyperlipidemia, unspecified: Secondary | ICD-10-CM | POA: Diagnosis not present

## 2018-09-19 DIAGNOSIS — M545 Low back pain: Secondary | ICD-10-CM | POA: Diagnosis not present

## 2018-09-19 DIAGNOSIS — I10 Essential (primary) hypertension: Secondary | ICD-10-CM | POA: Diagnosis not present

## 2018-09-19 DIAGNOSIS — Z1331 Encounter for screening for depression: Secondary | ICD-10-CM | POA: Diagnosis not present

## 2018-09-19 DIAGNOSIS — E1151 Type 2 diabetes mellitus with diabetic peripheral angiopathy without gangrene: Secondary | ICD-10-CM | POA: Diagnosis not present

## 2018-10-12 DIAGNOSIS — M5136 Other intervertebral disc degeneration, lumbar region: Secondary | ICD-10-CM | POA: Diagnosis not present

## 2018-10-17 DIAGNOSIS — Z85828 Personal history of other malignant neoplasm of skin: Secondary | ICD-10-CM | POA: Diagnosis not present

## 2018-10-17 DIAGNOSIS — L821 Other seborrheic keratosis: Secondary | ICD-10-CM | POA: Diagnosis not present

## 2018-10-17 DIAGNOSIS — L570 Actinic keratosis: Secondary | ICD-10-CM | POA: Diagnosis not present

## 2018-10-28 DIAGNOSIS — M48062 Spinal stenosis, lumbar region with neurogenic claudication: Secondary | ICD-10-CM | POA: Diagnosis not present

## 2018-10-28 DIAGNOSIS — M5136 Other intervertebral disc degeneration, lumbar region: Secondary | ICD-10-CM | POA: Diagnosis not present

## 2018-10-30 DIAGNOSIS — R69 Illness, unspecified: Secondary | ICD-10-CM | POA: Diagnosis not present

## 2018-11-05 DIAGNOSIS — M5136 Other intervertebral disc degeneration, lumbar region: Secondary | ICD-10-CM | POA: Diagnosis not present

## 2018-11-05 DIAGNOSIS — M5416 Radiculopathy, lumbar region: Secondary | ICD-10-CM | POA: Diagnosis not present

## 2018-11-05 DIAGNOSIS — M4180 Other forms of scoliosis, site unspecified: Secondary | ICD-10-CM | POA: Diagnosis not present

## 2018-11-22 DIAGNOSIS — Z7982 Long term (current) use of aspirin: Secondary | ICD-10-CM | POA: Diagnosis not present

## 2018-11-22 DIAGNOSIS — R32 Unspecified urinary incontinence: Secondary | ICD-10-CM | POA: Diagnosis not present

## 2018-11-22 DIAGNOSIS — Z87891 Personal history of nicotine dependence: Secondary | ICD-10-CM | POA: Diagnosis not present

## 2018-11-22 DIAGNOSIS — E785 Hyperlipidemia, unspecified: Secondary | ICD-10-CM | POA: Diagnosis not present

## 2018-11-22 DIAGNOSIS — Z7984 Long term (current) use of oral hypoglycemic drugs: Secondary | ICD-10-CM | POA: Diagnosis not present

## 2018-11-22 DIAGNOSIS — N400 Enlarged prostate without lower urinary tract symptoms: Secondary | ICD-10-CM | POA: Diagnosis not present

## 2018-11-22 DIAGNOSIS — K219 Gastro-esophageal reflux disease without esophagitis: Secondary | ICD-10-CM | POA: Diagnosis not present

## 2018-11-22 DIAGNOSIS — I10 Essential (primary) hypertension: Secondary | ICD-10-CM | POA: Diagnosis not present

## 2018-11-22 DIAGNOSIS — Z85828 Personal history of other malignant neoplasm of skin: Secondary | ICD-10-CM | POA: Diagnosis not present

## 2018-11-22 DIAGNOSIS — E119 Type 2 diabetes mellitus without complications: Secondary | ICD-10-CM | POA: Diagnosis not present

## 2018-11-29 DIAGNOSIS — E119 Type 2 diabetes mellitus without complications: Secondary | ICD-10-CM | POA: Diagnosis not present

## 2018-11-29 DIAGNOSIS — Z23 Encounter for immunization: Secondary | ICD-10-CM | POA: Diagnosis not present

## 2019-01-21 DIAGNOSIS — M545 Low back pain: Secondary | ICD-10-CM | POA: Diagnosis not present

## 2019-01-21 DIAGNOSIS — I10 Essential (primary) hypertension: Secondary | ICD-10-CM | POA: Diagnosis not present

## 2019-01-21 DIAGNOSIS — E785 Hyperlipidemia, unspecified: Secondary | ICD-10-CM | POA: Diagnosis not present

## 2019-03-07 ENCOUNTER — Ambulatory Visit: Payer: Medicare HMO | Attending: Internal Medicine

## 2019-03-07 DIAGNOSIS — Z23 Encounter for immunization: Secondary | ICD-10-CM | POA: Insufficient documentation

## 2019-03-07 NOTE — Progress Notes (Signed)
   Covid-19 Vaccination Clinic  Name:  Kevin Cole    MRN: [id number] DOB: [birth year]  03/07/2019  [name] was observed post Covid-19 immunization for 15 minutes without incidence. He was provided with Vaccine Information Sheet and instruction to access the V-Safe system.   [name] was instructed to call 911 with any severe reactions post vaccine: Marland Kitchen Difficulty breathing  . Swelling of your face and throat  . A fast heartbeat  . A bad rash all over your body  . Dizziness and weakness    Immunizations Administered    Name Date Dose VIS Date Route   Pfizer COVID-19 Vaccine 03/07/2019  2:26 PM 0.3 mL 01/24/2019 Intramuscular   Manufacturer: [place]   Lot: [id number]   [place]: [id number]

## 2019-03-28 ENCOUNTER — Ambulatory Visit: Payer: Medicare HMO | Attending: Internal Medicine

## 2019-03-28 DIAGNOSIS — Z23 Encounter for immunization: Secondary | ICD-10-CM | POA: Insufficient documentation

## 2019-03-28 NOTE — Progress Notes (Signed)
   Covid-19 Vaccination Clinic  Name:  Kevin Cole    MRN: [id number] DOB: [birth year]  03/28/2019  Kevin Cole was observed post Covid-19 immunization for 15 minutes without incidence. He was provided with Vaccine Information Sheet and instruction to access the V-Safe system.   Kevin Cole was instructed to call 911 with any severe reactions post vaccine: Marland Kitchen Difficulty breathing  . Swelling of your face and throat  . A fast heartbeat  . A bad rash all over your body  . Dizziness and weakness    Immunizations Administered    Name Date Dose VIS Date Route   Pfizer COVID-19 Vaccine 03/28/2019 10:25 AM 0.3 mL 01/24/2019 Intramuscular   Manufacturer: [place]   Lot: [id number]   [place]: [id number]

## 2019-03-30 ENCOUNTER — Ambulatory Visit: Payer: Medicare HMO

## 2019-04-09 DIAGNOSIS — Z85828 Personal history of other malignant neoplasm of skin: Secondary | ICD-10-CM | POA: Diagnosis not present

## 2019-04-09 DIAGNOSIS — D225 Melanocytic nevi of trunk: Secondary | ICD-10-CM | POA: Diagnosis not present

## 2019-04-09 DIAGNOSIS — L821 Other seborrheic keratosis: Secondary | ICD-10-CM | POA: Diagnosis not present

## 2019-04-09 DIAGNOSIS — L570 Actinic keratosis: Secondary | ICD-10-CM | POA: Diagnosis not present

## 2019-04-09 DIAGNOSIS — D1801 Hemangioma of skin and subcutaneous tissue: Secondary | ICD-10-CM | POA: Diagnosis not present

## 2019-04-09 DIAGNOSIS — L812 Freckles: Secondary | ICD-10-CM | POA: Diagnosis not present

## 2019-04-15 DIAGNOSIS — H903 Sensorineural hearing loss, bilateral: Secondary | ICD-10-CM | POA: Diagnosis not present

## 2019-05-05 DIAGNOSIS — M5416 Radiculopathy, lumbar region: Secondary | ICD-10-CM | POA: Diagnosis not present

## 2019-05-05 DIAGNOSIS — M5136 Other intervertebral disc degeneration, lumbar region: Secondary | ICD-10-CM | POA: Diagnosis not present

## 2019-05-21 DIAGNOSIS — Z0000 Encounter for general adult medical examination without abnormal findings: Secondary | ICD-10-CM | POA: Diagnosis not present

## 2019-05-21 DIAGNOSIS — Z125 Encounter for screening for malignant neoplasm of prostate: Secondary | ICD-10-CM | POA: Diagnosis not present

## 2019-05-21 DIAGNOSIS — E7849 Other hyperlipidemia: Secondary | ICD-10-CM | POA: Diagnosis not present

## 2019-05-21 DIAGNOSIS — E1151 Type 2 diabetes mellitus with diabetic peripheral angiopathy without gangrene: Secondary | ICD-10-CM | POA: Diagnosis not present

## 2019-05-29 DIAGNOSIS — Z0000 Encounter for general adult medical examination without abnormal findings: Secondary | ICD-10-CM | POA: Diagnosis not present

## 2019-05-29 DIAGNOSIS — E1151 Type 2 diabetes mellitus with diabetic peripheral angiopathy without gangrene: Secondary | ICD-10-CM | POA: Diagnosis not present

## 2019-05-29 DIAGNOSIS — H6121 Impacted cerumen, right ear: Secondary | ICD-10-CM | POA: Diagnosis not present

## 2019-05-29 DIAGNOSIS — R82998 Other abnormal findings in urine: Secondary | ICD-10-CM | POA: Diagnosis not present

## 2019-05-29 DIAGNOSIS — Z1339 Encounter for screening examination for other mental health and behavioral disorders: Secondary | ICD-10-CM | POA: Diagnosis not present

## 2019-05-29 DIAGNOSIS — I10 Essential (primary) hypertension: Secondary | ICD-10-CM | POA: Diagnosis not present

## 2019-05-29 DIAGNOSIS — R351 Nocturia: Secondary | ICD-10-CM | POA: Diagnosis not present

## 2019-05-29 DIAGNOSIS — I739 Peripheral vascular disease, unspecified: Secondary | ICD-10-CM | POA: Diagnosis not present

## 2019-05-29 DIAGNOSIS — E785 Hyperlipidemia, unspecified: Secondary | ICD-10-CM | POA: Diagnosis not present

## 2019-05-29 DIAGNOSIS — Z1331 Encounter for screening for depression: Secondary | ICD-10-CM | POA: Diagnosis not present

## 2019-05-29 DIAGNOSIS — R972 Elevated prostate specific antigen [PSA]: Secondary | ICD-10-CM | POA: Diagnosis not present

## 2019-05-29 DIAGNOSIS — R200 Anesthesia of skin: Secondary | ICD-10-CM | POA: Diagnosis not present

## 2019-06-03 DIAGNOSIS — M5416 Radiculopathy, lumbar region: Secondary | ICD-10-CM | POA: Diagnosis not present

## 2019-07-16 DIAGNOSIS — E103292 Type 1 diabetes mellitus with mild nonproliferative diabetic retinopathy without macular edema, left eye: Secondary | ICD-10-CM | POA: Diagnosis not present

## 2019-07-16 DIAGNOSIS — H52203 Unspecified astigmatism, bilateral: Secondary | ICD-10-CM | POA: Diagnosis not present

## 2019-07-16 DIAGNOSIS — H2513 Age-related nuclear cataract, bilateral: Secondary | ICD-10-CM | POA: Diagnosis not present

## 2019-07-21 DIAGNOSIS — Z0100 Encounter for examination of eyes and vision without abnormal findings: Secondary | ICD-10-CM | POA: Diagnosis not present

## 2019-09-09 DIAGNOSIS — R972 Elevated prostate specific antigen [PSA]: Secondary | ICD-10-CM | POA: Diagnosis not present

## 2019-09-26 DIAGNOSIS — I10 Essential (primary) hypertension: Secondary | ICD-10-CM | POA: Diagnosis not present

## 2019-09-26 DIAGNOSIS — E1151 Type 2 diabetes mellitus with diabetic peripheral angiopathy without gangrene: Secondary | ICD-10-CM | POA: Diagnosis not present

## 2019-09-26 DIAGNOSIS — E785 Hyperlipidemia, unspecified: Secondary | ICD-10-CM | POA: Diagnosis not present

## 2019-09-26 DIAGNOSIS — M545 Low back pain: Secondary | ICD-10-CM | POA: Diagnosis not present

## 2019-09-26 DIAGNOSIS — R972 Elevated prostate specific antigen [PSA]: Secondary | ICD-10-CM | POA: Diagnosis not present

## 2019-09-26 DIAGNOSIS — E1142 Type 2 diabetes mellitus with diabetic polyneuropathy: Secondary | ICD-10-CM | POA: Diagnosis not present

## 2019-10-23 DIAGNOSIS — E1136 Type 2 diabetes mellitus with diabetic cataract: Secondary | ICD-10-CM | POA: Diagnosis not present

## 2019-10-23 DIAGNOSIS — K219 Gastro-esophageal reflux disease without esophagitis: Secondary | ICD-10-CM | POA: Diagnosis not present

## 2019-10-23 DIAGNOSIS — I10 Essential (primary) hypertension: Secondary | ICD-10-CM | POA: Diagnosis not present

## 2019-10-23 DIAGNOSIS — E1143 Type 2 diabetes mellitus with diabetic autonomic (poly)neuropathy: Secondary | ICD-10-CM | POA: Diagnosis not present

## 2019-10-23 DIAGNOSIS — E785 Hyperlipidemia, unspecified: Secondary | ICD-10-CM | POA: Diagnosis not present

## 2019-10-23 DIAGNOSIS — I2510 Atherosclerotic heart disease of native coronary artery without angina pectoris: Secondary | ICD-10-CM | POA: Diagnosis not present

## 2019-10-23 DIAGNOSIS — Z7722 Contact with and (suspected) exposure to environmental tobacco smoke (acute) (chronic): Secondary | ICD-10-CM | POA: Diagnosis not present

## 2019-10-23 DIAGNOSIS — R32 Unspecified urinary incontinence: Secondary | ICD-10-CM | POA: Diagnosis not present

## 2019-10-23 DIAGNOSIS — N529 Male erectile dysfunction, unspecified: Secondary | ICD-10-CM | POA: Diagnosis not present

## 2019-10-23 DIAGNOSIS — N400 Enlarged prostate without lower urinary tract symptoms: Secondary | ICD-10-CM | POA: Diagnosis not present

## 2019-11-07 DIAGNOSIS — Z0100 Encounter for examination of eyes and vision without abnormal findings: Secondary | ICD-10-CM | POA: Diagnosis not present

## 2019-11-15 DIAGNOSIS — Z23 Encounter for immunization: Secondary | ICD-10-CM | POA: Diagnosis not present

## 2019-11-17 DIAGNOSIS — R3912 Poor urinary stream: Secondary | ICD-10-CM | POA: Diagnosis not present

## 2019-11-17 DIAGNOSIS — N3943 Post-void dribbling: Secondary | ICD-10-CM | POA: Diagnosis not present

## 2019-11-17 DIAGNOSIS — N401 Enlarged prostate with lower urinary tract symptoms: Secondary | ICD-10-CM | POA: Diagnosis not present

## 2019-11-17 DIAGNOSIS — R972 Elevated prostate specific antigen [PSA]: Secondary | ICD-10-CM | POA: Diagnosis not present

## 2020-01-27 DIAGNOSIS — I739 Peripheral vascular disease, unspecified: Secondary | ICD-10-CM | POA: Diagnosis not present

## 2020-01-27 DIAGNOSIS — E1142 Type 2 diabetes mellitus with diabetic polyneuropathy: Secondary | ICD-10-CM | POA: Diagnosis not present

## 2020-01-27 DIAGNOSIS — E1151 Type 2 diabetes mellitus with diabetic peripheral angiopathy without gangrene: Secondary | ICD-10-CM | POA: Diagnosis not present

## 2020-01-27 DIAGNOSIS — J310 Chronic rhinitis: Secondary | ICD-10-CM | POA: Diagnosis not present

## 2020-01-27 DIAGNOSIS — E785 Hyperlipidemia, unspecified: Secondary | ICD-10-CM | POA: Diagnosis not present

## 2020-01-27 DIAGNOSIS — I10 Essential (primary) hypertension: Secondary | ICD-10-CM | POA: Diagnosis not present

## 2020-03-24 DIAGNOSIS — D1801 Hemangioma of skin and subcutaneous tissue: Secondary | ICD-10-CM | POA: Diagnosis not present

## 2020-03-24 DIAGNOSIS — Z85828 Personal history of other malignant neoplasm of skin: Secondary | ICD-10-CM | POA: Diagnosis not present

## 2020-03-24 DIAGNOSIS — L821 Other seborrheic keratosis: Secondary | ICD-10-CM | POA: Diagnosis not present

## 2020-03-24 DIAGNOSIS — L570 Actinic keratosis: Secondary | ICD-10-CM | POA: Diagnosis not present

## 2020-03-24 DIAGNOSIS — L812 Freckles: Secondary | ICD-10-CM | POA: Diagnosis not present

## 2020-04-07 DIAGNOSIS — H9190 Unspecified hearing loss, unspecified ear: Secondary | ICD-10-CM | POA: Diagnosis not present

## 2020-04-20 DIAGNOSIS — Z008 Encounter for other general examination: Secondary | ICD-10-CM | POA: Diagnosis not present

## 2020-04-20 DIAGNOSIS — E785 Hyperlipidemia, unspecified: Secondary | ICD-10-CM | POA: Diagnosis not present

## 2020-04-20 DIAGNOSIS — E1136 Type 2 diabetes mellitus with diabetic cataract: Secondary | ICD-10-CM | POA: Diagnosis not present

## 2020-04-20 DIAGNOSIS — R32 Unspecified urinary incontinence: Secondary | ICD-10-CM | POA: Diagnosis not present

## 2020-04-20 DIAGNOSIS — E1140 Type 2 diabetes mellitus with diabetic neuropathy, unspecified: Secondary | ICD-10-CM | POA: Diagnosis not present

## 2020-04-20 DIAGNOSIS — J309 Allergic rhinitis, unspecified: Secondary | ICD-10-CM | POA: Diagnosis not present

## 2020-04-20 DIAGNOSIS — I10 Essential (primary) hypertension: Secondary | ICD-10-CM | POA: Diagnosis not present

## 2020-04-20 DIAGNOSIS — K219 Gastro-esophageal reflux disease without esophagitis: Secondary | ICD-10-CM | POA: Diagnosis not present

## 2020-04-20 DIAGNOSIS — E1143 Type 2 diabetes mellitus with diabetic autonomic (poly)neuropathy: Secondary | ICD-10-CM | POA: Diagnosis not present

## 2020-04-20 DIAGNOSIS — Z7722 Contact with and (suspected) exposure to environmental tobacco smoke (acute) (chronic): Secondary | ICD-10-CM | POA: Diagnosis not present

## 2020-04-20 DIAGNOSIS — N400 Enlarged prostate without lower urinary tract symptoms: Secondary | ICD-10-CM | POA: Diagnosis not present

## 2020-06-03 DIAGNOSIS — Z125 Encounter for screening for malignant neoplasm of prostate: Secondary | ICD-10-CM | POA: Diagnosis not present

## 2020-06-03 DIAGNOSIS — E1151 Type 2 diabetes mellitus with diabetic peripheral angiopathy without gangrene: Secondary | ICD-10-CM | POA: Diagnosis not present

## 2020-06-03 DIAGNOSIS — E785 Hyperlipidemia, unspecified: Secondary | ICD-10-CM | POA: Diagnosis not present

## 2020-06-14 DIAGNOSIS — Z0000 Encounter for general adult medical examination without abnormal findings: Secondary | ICD-10-CM | POA: Diagnosis not present

## 2020-06-14 DIAGNOSIS — I10 Essential (primary) hypertension: Secondary | ICD-10-CM | POA: Diagnosis not present

## 2020-06-14 DIAGNOSIS — E1151 Type 2 diabetes mellitus with diabetic peripheral angiopathy without gangrene: Secondary | ICD-10-CM | POA: Diagnosis not present

## 2020-06-14 DIAGNOSIS — R1310 Dysphagia, unspecified: Secondary | ICD-10-CM | POA: Diagnosis not present

## 2020-06-14 DIAGNOSIS — M5416 Radiculopathy, lumbar region: Secondary | ICD-10-CM | POA: Diagnosis not present

## 2020-06-14 DIAGNOSIS — K219 Gastro-esophageal reflux disease without esophagitis: Secondary | ICD-10-CM | POA: Diagnosis not present

## 2020-06-14 DIAGNOSIS — J309 Allergic rhinitis, unspecified: Secondary | ICD-10-CM | POA: Diagnosis not present

## 2020-06-14 DIAGNOSIS — R972 Elevated prostate specific antigen [PSA]: Secondary | ICD-10-CM | POA: Diagnosis not present

## 2020-06-14 DIAGNOSIS — Z1212 Encounter for screening for malignant neoplasm of rectum: Secondary | ICD-10-CM | POA: Diagnosis not present

## 2020-06-14 DIAGNOSIS — E785 Hyperlipidemia, unspecified: Secondary | ICD-10-CM | POA: Diagnosis not present

## 2020-06-14 DIAGNOSIS — I739 Peripheral vascular disease, unspecified: Secondary | ICD-10-CM | POA: Diagnosis not present

## 2020-06-14 DIAGNOSIS — R82998 Other abnormal findings in urine: Secondary | ICD-10-CM | POA: Diagnosis not present

## 2020-07-21 DIAGNOSIS — E119 Type 2 diabetes mellitus without complications: Secondary | ICD-10-CM | POA: Diagnosis not present

## 2020-07-21 DIAGNOSIS — H52203 Unspecified astigmatism, bilateral: Secondary | ICD-10-CM | POA: Diagnosis not present

## 2020-07-21 DIAGNOSIS — H2513 Age-related nuclear cataract, bilateral: Secondary | ICD-10-CM | POA: Diagnosis not present

## 2020-12-16 DIAGNOSIS — E1151 Type 2 diabetes mellitus with diabetic peripheral angiopathy without gangrene: Secondary | ICD-10-CM | POA: Diagnosis not present

## 2020-12-16 DIAGNOSIS — I739 Peripheral vascular disease, unspecified: Secondary | ICD-10-CM | POA: Diagnosis not present

## 2020-12-16 DIAGNOSIS — E1142 Type 2 diabetes mellitus with diabetic polyneuropathy: Secondary | ICD-10-CM | POA: Diagnosis not present

## 2020-12-16 DIAGNOSIS — I10 Essential (primary) hypertension: Secondary | ICD-10-CM | POA: Diagnosis not present

## 2020-12-16 DIAGNOSIS — M5416 Radiculopathy, lumbar region: Secondary | ICD-10-CM | POA: Diagnosis not present

## 2020-12-16 DIAGNOSIS — Z23 Encounter for immunization: Secondary | ICD-10-CM | POA: Diagnosis not present

## 2020-12-16 DIAGNOSIS — E785 Hyperlipidemia, unspecified: Secondary | ICD-10-CM | POA: Diagnosis not present

## 2021-02-28 ENCOUNTER — Other Ambulatory Visit (HOSPITAL_COMMUNITY): Payer: Self-pay

## 2021-03-28 DIAGNOSIS — D1801 Hemangioma of skin and subcutaneous tissue: Secondary | ICD-10-CM | POA: Diagnosis not present

## 2021-03-28 DIAGNOSIS — L812 Freckles: Secondary | ICD-10-CM | POA: Diagnosis not present

## 2021-03-28 DIAGNOSIS — L821 Other seborrheic keratosis: Secondary | ICD-10-CM | POA: Diagnosis not present

## 2021-03-28 DIAGNOSIS — Z85828 Personal history of other malignant neoplasm of skin: Secondary | ICD-10-CM | POA: Diagnosis not present

## 2021-04-20 DIAGNOSIS — M545 Low back pain, unspecified: Secondary | ICD-10-CM | POA: Diagnosis not present

## 2021-04-28 DIAGNOSIS — M545 Low back pain, unspecified: Secondary | ICD-10-CM | POA: Diagnosis not present

## 2021-05-05 DIAGNOSIS — M545 Low back pain, unspecified: Secondary | ICD-10-CM | POA: Diagnosis not present

## 2021-05-12 DIAGNOSIS — M545 Low back pain, unspecified: Secondary | ICD-10-CM | POA: Diagnosis not present

## 2021-05-13 DIAGNOSIS — E785 Hyperlipidemia, unspecified: Secondary | ICD-10-CM | POA: Diagnosis not present

## 2021-05-13 DIAGNOSIS — M5416 Radiculopathy, lumbar region: Secondary | ICD-10-CM | POA: Diagnosis not present

## 2021-05-13 DIAGNOSIS — R5382 Chronic fatigue, unspecified: Secondary | ICD-10-CM | POA: Diagnosis not present

## 2021-05-13 DIAGNOSIS — K219 Gastro-esophageal reflux disease without esophagitis: Secondary | ICD-10-CM | POA: Diagnosis not present

## 2021-05-13 DIAGNOSIS — D509 Iron deficiency anemia, unspecified: Secondary | ICD-10-CM | POA: Diagnosis not present

## 2021-05-13 DIAGNOSIS — I739 Peripheral vascular disease, unspecified: Secondary | ICD-10-CM | POA: Diagnosis not present

## 2021-05-13 DIAGNOSIS — E1151 Type 2 diabetes mellitus with diabetic peripheral angiopathy without gangrene: Secondary | ICD-10-CM | POA: Diagnosis not present

## 2021-05-13 DIAGNOSIS — I10 Essential (primary) hypertension: Secondary | ICD-10-CM | POA: Diagnosis not present

## 2021-05-13 DIAGNOSIS — Z79899 Other long term (current) drug therapy: Secondary | ICD-10-CM | POA: Diagnosis not present

## 2021-05-13 DIAGNOSIS — D649 Anemia, unspecified: Secondary | ICD-10-CM | POA: Diagnosis not present

## 2021-05-19 DIAGNOSIS — M545 Low back pain, unspecified: Secondary | ICD-10-CM | POA: Diagnosis not present

## 2021-08-01 DIAGNOSIS — R5382 Chronic fatigue, unspecified: Secondary | ICD-10-CM | POA: Diagnosis not present

## 2021-08-01 DIAGNOSIS — E1151 Type 2 diabetes mellitus with diabetic peripheral angiopathy without gangrene: Secondary | ICD-10-CM | POA: Diagnosis not present

## 2021-08-01 DIAGNOSIS — Z125 Encounter for screening for malignant neoplasm of prostate: Secondary | ICD-10-CM | POA: Diagnosis not present

## 2021-08-01 DIAGNOSIS — D649 Anemia, unspecified: Secondary | ICD-10-CM | POA: Diagnosis not present

## 2021-08-01 DIAGNOSIS — E785 Hyperlipidemia, unspecified: Secondary | ICD-10-CM | POA: Diagnosis not present

## 2021-08-01 DIAGNOSIS — I10 Essential (primary) hypertension: Secondary | ICD-10-CM | POA: Diagnosis not present

## 2021-08-01 DIAGNOSIS — R7989 Other specified abnormal findings of blood chemistry: Secondary | ICD-10-CM | POA: Diagnosis not present

## 2021-08-03 ENCOUNTER — Encounter: Payer: Self-pay | Admitting: Internal Medicine

## 2021-08-09 DIAGNOSIS — H52203 Unspecified astigmatism, bilateral: Secondary | ICD-10-CM | POA: Diagnosis not present

## 2021-08-09 DIAGNOSIS — H2513 Age-related nuclear cataract, bilateral: Secondary | ICD-10-CM | POA: Diagnosis not present

## 2021-08-09 DIAGNOSIS — E113292 Type 2 diabetes mellitus with mild nonproliferative diabetic retinopathy without macular edema, left eye: Secondary | ICD-10-CM | POA: Diagnosis not present

## 2021-08-26 DIAGNOSIS — I10 Essential (primary) hypertension: Secondary | ICD-10-CM | POA: Diagnosis not present

## 2021-08-26 DIAGNOSIS — R972 Elevated prostate specific antigen [PSA]: Secondary | ICD-10-CM | POA: Diagnosis not present

## 2021-08-26 DIAGNOSIS — Z1331 Encounter for screening for depression: Secondary | ICD-10-CM | POA: Diagnosis not present

## 2021-08-26 DIAGNOSIS — R82998 Other abnormal findings in urine: Secondary | ICD-10-CM | POA: Diagnosis not present

## 2021-08-26 DIAGNOSIS — M5416 Radiculopathy, lumbar region: Secondary | ICD-10-CM | POA: Diagnosis not present

## 2021-08-26 DIAGNOSIS — Z0000 Encounter for general adult medical examination without abnormal findings: Secondary | ICD-10-CM | POA: Diagnosis not present

## 2021-08-26 DIAGNOSIS — I739 Peripheral vascular disease, unspecified: Secondary | ICD-10-CM | POA: Diagnosis not present

## 2021-08-26 DIAGNOSIS — E1151 Type 2 diabetes mellitus with diabetic peripheral angiopathy without gangrene: Secondary | ICD-10-CM | POA: Diagnosis not present

## 2021-08-26 DIAGNOSIS — E785 Hyperlipidemia, unspecified: Secondary | ICD-10-CM | POA: Diagnosis not present

## 2021-08-26 DIAGNOSIS — Z1389 Encounter for screening for other disorder: Secondary | ICD-10-CM | POA: Diagnosis not present

## 2021-08-26 DIAGNOSIS — D509 Iron deficiency anemia, unspecified: Secondary | ICD-10-CM | POA: Diagnosis not present

## 2021-09-08 DIAGNOSIS — H269 Unspecified cataract: Secondary | ICD-10-CM | POA: Diagnosis not present

## 2021-09-08 DIAGNOSIS — H21561 Pupillary abnormality, right eye: Secondary | ICD-10-CM | POA: Diagnosis not present

## 2021-09-08 DIAGNOSIS — H2511 Age-related nuclear cataract, right eye: Secondary | ICD-10-CM | POA: Diagnosis not present

## 2021-09-08 DIAGNOSIS — H2181 Floppy iris syndrome: Secondary | ICD-10-CM | POA: Diagnosis not present

## 2021-09-20 ENCOUNTER — Encounter: Payer: Self-pay | Admitting: Nurse Practitioner

## 2021-09-22 DIAGNOSIS — H269 Unspecified cataract: Secondary | ICD-10-CM | POA: Diagnosis not present

## 2021-09-22 DIAGNOSIS — H21562 Pupillary abnormality, left eye: Secondary | ICD-10-CM | POA: Diagnosis not present

## 2021-09-22 DIAGNOSIS — H2512 Age-related nuclear cataract, left eye: Secondary | ICD-10-CM | POA: Diagnosis not present

## 2021-09-27 ENCOUNTER — Ambulatory Visit (HOSPITAL_COMMUNITY)
Admission: RE | Admit: 2021-09-27 | Discharge: 2021-09-27 | Disposition: A | Discharge status: Home / Self Care | Payer: PPO | Source: Ambulatory Visit | Attending: Physician Assistant | Admitting: Physician Assistant

## 2021-09-27 ENCOUNTER — Encounter (HOSPITAL_COMMUNITY): Payer: Self-pay | Admitting: Physician Assistant

## 2021-09-27 VITALS — BP 134/76 | HR 59 | Ht 70.0 in | Wt 157.4 lb

## 2021-09-27 DIAGNOSIS — Z7901 Long term (current) use of anticoagulants: Secondary | ICD-10-CM | POA: Insufficient documentation

## 2021-09-27 DIAGNOSIS — E119 Type 2 diabetes mellitus without complications: Secondary | ICD-10-CM | POA: Insufficient documentation

## 2021-09-27 DIAGNOSIS — D6869 Other thrombophilia: Secondary | ICD-10-CM | POA: Diagnosis not present

## 2021-09-27 DIAGNOSIS — D509 Iron deficiency anemia, unspecified: Secondary | ICD-10-CM | POA: Insufficient documentation

## 2021-09-27 DIAGNOSIS — I4819 Other persistent atrial fibrillation: Secondary | ICD-10-CM | POA: Insufficient documentation

## 2021-09-27 DIAGNOSIS — I10 Essential (primary) hypertension: Secondary | ICD-10-CM | POA: Diagnosis not present

## 2021-09-27 MED ORDER — APIXABAN 5 MG PO TABS: 5.0000 mg | ORAL_TABLET | Freq: Two times a day (BID) | ORAL | 11 refills | Status: DC

## 2021-09-27 NOTE — Patient Instructions (Signed)
Start Eliquis '5mg'$  Daily

## 2021-09-27 NOTE — Progress Notes (Addendum)
Primary Care Physician: Donnajean Lopes, MD Primary Cardiologist: none Primary Electrophysiologist: none Referring Physician: Dr Simonne Come is a 88 y.o. male with a history of HLD, HTN, DM, iron deficiency anemia, atrial fibrillation who presents for consultation in the [facility].  The patient was initially diagnosed with atrial fibrillation 09/22/21 after having cataract surgery. His pulse was noted to be irregular and an ECG showed rate controlled afib, PCP referred him to AF clinic. Patient has a CHADS2VASC score of 4. He states that he is unaware of his afib with no associated symptoms. He has noted an irregular heart beat on his BP machine occasionally which has been ongoing for several years.   Today, he denies symptoms of palpitations, chest pain, shortness of breath, orthopnea, PND, lower extremity edema, dizziness, presyncope, syncope, snoring, daytime somnolence, bleeding, or neurologic sequela. The patient is tolerating medications without difficulties and is otherwise without complaint today.    Atrial Fibrillation Risk Factors:  he does not have symptoms or diagnosis of sleep apnea. he does not have a history of rheumatic fever.   he has a BMI of Body mass index is 22.58 kg/m.Marland Kitchen Filed Weights   09/27/21 0947  Weight: 71.4 kg    Family History  Problem Relation Age of Onset   Lung cancer Unknown    Alcohol abuse Unknown    Colon cancer Neg Hx      Atrial Fibrillation Management history:  Previous antiarrhythmic drugs: none Previous cardioversions: none Previous ablations: none CHADS2VASC score: 4 Anticoagulation history: none   Past Medical History:  Diagnosis Date   Allergic rhinitis    Arthritis    LUMBAR - RECENT PHYSICAL THERAPY -WAS HAVING LOWER BACK PAIN   Colon polyps    type unknown   Diabetes mellitus, type 2 ([facility])    GERD (gastroesophageal reflux disease)    Hemorrhoids    History of kidney  stones    Hyperlipidemia    Hypertension    Past Surgical History:  Procedure Laterality Date   CYSTO FOR BLADDER STONE     CYSTOSCOPY WITH RETROGRADE PYELOGRAM, URETEROSCOPY AND STENT PLACEMENT Left 07/29/2012   Procedure: CYSTOSCOPY WITH LEFT RETROGRADE PYELOGRAM, LEFT URETEROSCOPY AND STENT PLACEMENT;  Surgeon: Claybon Jabs, MD;  Location: [facility];  Service: Urology;  Laterality: Left;   HOLMIUM LASER APPLICATION Left 7/82/9562   Procedure: HOLMIUM LASER APPLICATION with basket retrival of stone;  Surgeon: Claybon Jabs, MD;  Location: [facility];  Service: Urology;  Laterality: Left;    Current Outpatient Medications  Medication Sig Dispense Refill   apixaban (ELIQUIS) 5 MG TABS tablet Take 1 tablet (5 mg total) by mouth 2 (two) times daily. 60 tablet 11   atorvastatin (LIPITOR) 40 MG tablet 1 tablet every day by oral route.     co-enzyme Q-10 50 MG capsule Take 50 mg by mouth daily.      cyanocobalamin 100 MCG tablet Take by mouth.     Flaxseed, Linseed, (FLAX SEED OIL) 1000 MG CAPS Take 1 capsule by mouth 2 (two) times daily after a meal.      fluticasone (FLONASE) 50 MCG/ACT nasal spray      glimepiride (AMARYL) 4 MG tablet      glucose blood test strip OneTouch Ultra Blue Test Strip     hydrocortisone (ANUSOL-HC) 2.5 % rectal cream Place 1 application rectally 2 (two) times daily as needed for hemorrhoids.      irbesartan (AVAPRO) 300  MG tablet Take 300 mg by mouth every morning.     Lancets (ONETOUCH DELICA PLUS [id number]) MISC SMARTSIG:1 Topical Daily     loratadine (CLARITIN) 10 MG tablet Take by mouth.     metFORMIN (GLUCOPHAGE) 500 MG tablet Take 1,000 mg by mouth 2 (two) times daily with a meal.      metoprolol succinate (TOPROL-XL) 25 MG 24 hr tablet Take 12.5 mg by mouth daily.     Multiple Vitamin (MULTIVITAMIN) tablet Take 1 tablet by mouth daily.      omeprazole (PRILOSEC) 10 MG capsule Take 10 mg by mouth daily.     POLY-IRON 150 FORTE 150-25-1 MG-MCG-MG CAPS Take 1  capsule by mouth daily.     tamsulosin (FLOMAX) 0.4 MG CAPS capsule      No current facility-administered medications for this encounter.    No Known Allergies  Social History   Socioeconomic History   Marital status: Married    Spouse name: Not on file   Number of children: 2   Years of education: Not on file   Highest education level: Not on file  Occupational History   Occupation: Retired  Tobacco Use   Smoking status: Former   Smokeless tobacco: Never   Tobacco comments:    Former smoker 09/27/21  Substance and Sexual Activity   Alcohol use: Yes    Alcohol/week: 8.0 - 9.0 standard drinks of alcohol    Types: 8 - 9 Shots of liquor per week    Comment: 1-2 shots daily 09/27/21   Drug use: No   Sexual activity: Not on file  Other Topics Concern   Not on file  Social History Narrative   ** Merged History Encounter **       Daily caffeine use   Social Determinants of Health   Financial Resource Strain: Not on file  Food Insecurity: Not on file  Transportation Needs: Not on file  Physical Activity: Not on file  Stress: Not on file  Social Connections: Not on file  Intimate Partner Violence: Not on file     ROS- All systems are reviewed and negative except as per the HPI above.  Physical Exam: Vitals:   09/27/21 0947  BP: 134/76  Pulse: (!) 59  Weight: 71.4 kg  Height: '5\' 10"'$  (1.778 m)    GEN- The patient is a well appearing elderly male, alert and oriented x 3 today.   Head- normocephalic, atraumatic Eyes-  Sclera clear, conjunctiva pink Ears- hearing intact Oropharynx- clear Neck- supple  Lungs- Clear to ausculation bilaterally, normal work of breathing Heart- irregular rate and rhythm, no murmurs, rubs or gallops  GI- soft, NT, ND, + BS Extremities- no clubbing, cyanosis, or edema MS- no significant deformity or atrophy Skin- no rash or lesion Psych- euthymic mood, full affect Neuro- strength and sensation are intact  Wt Readings from Last 3  Encounters:  09/27/21 71.4 kg  08/29/17 75.5 kg  07/24/12 76.7 kg    EKG today demonstrates  Afib Vent. rate 59 BPM PR interval * ms QRS duration 76 ms QT/QTcB 432/427 ms   Epic records are reviewed at length today  CHA2DS2-VASc Score = 4  The patient's score is based upon: CHF History: 0 HTN History: 1 Diabetes History: 1 Stroke History: 0 Vascular Disease History: 0 Age Score: 2 Gender Score: 0       ASSESSMENT AND PLAN: 1. Persistent Atrial Fibrillation (ICD10:  I48.19) The patient's CHA2DS2-VASc score is 4, indicating a 4.8% annual risk of  stroke.   General education about afib provided and questions answered. We also discussed his stroke risk and the risks and benefits of anticoagulation. Unclear duration, possibly longstanding. Check echocardiogram Will start Eliquis 5 mg BID, recent labs in care everywhere reviewed.  Continue Toprol 12.5 mg daily We also discussed rate vs rhythm control. Given his age and paucity of symptoms, he may opt for a conservative rate control strategy.   2. Secondary Hypercoagulable State (ICD10:  D68.69) The patient is at significant risk for stroke/thromboembolism based upon his CHA2DS2-VASc Score of 4.  Continue Apixaban (Eliquis).   3. HTN Stable, no changes today.   Follow up in the AF clinic in 3-4 weeks.    [facility] [address] [place], [place] [address] [phone] 09/27/2021 3:23 PM

## 2021-10-06 ENCOUNTER — Ambulatory Visit (HOSPITAL_COMMUNITY)
Admission: RE | Admit: 2021-10-06 | Discharge: 2021-10-06 | Disposition: A | Discharge status: Home / Self Care | Payer: PPO | Source: Ambulatory Visit | Attending: Physician Assistant | Admitting: Physician Assistant

## 2021-10-06 DIAGNOSIS — I10 Essential (primary) hypertension: Secondary | ICD-10-CM | POA: Insufficient documentation

## 2021-10-06 DIAGNOSIS — I4819 Other persistent atrial fibrillation: Secondary | ICD-10-CM | POA: Diagnosis not present

## 2021-10-06 DIAGNOSIS — E119 Type 2 diabetes mellitus without complications: Secondary | ICD-10-CM | POA: Insufficient documentation

## 2021-10-06 DIAGNOSIS — E785 Hyperlipidemia, unspecified: Secondary | ICD-10-CM | POA: Diagnosis not present

## 2021-10-06 DIAGNOSIS — Z87891 Personal history of nicotine dependence: Secondary | ICD-10-CM | POA: Insufficient documentation

## 2021-10-06 LAB — ECHOCARDIOGRAM COMPLETE
Area-P 1/2: 2.83 cm2
P 1/2 time: 534 msec
S' Lateral: 3.6 cm

## 2021-10-20 ENCOUNTER — Other Ambulatory Visit: Payer: PPO

## 2021-10-20 ENCOUNTER — Encounter: Payer: Self-pay | Admitting: Nurse Practitioner

## 2021-10-20 ENCOUNTER — Ambulatory Visit: Payer: PPO | Admitting: Nurse Practitioner

## 2021-10-20 ENCOUNTER — Other Ambulatory Visit (INDEPENDENT_AMBULATORY_CARE_PROVIDER_SITE_OTHER): Payer: PPO

## 2021-10-20 VITALS — BP 146/72 | HR 55 | Ht 70.0 in | Wt 160.0 lb

## 2021-10-20 DIAGNOSIS — D649 Anemia, unspecified: Secondary | ICD-10-CM

## 2021-10-20 DIAGNOSIS — Z86010 Personal history of colonic polyps: Secondary | ICD-10-CM

## 2021-10-20 DIAGNOSIS — D509 Iron deficiency anemia, unspecified: Secondary | ICD-10-CM

## 2021-10-20 DIAGNOSIS — K219 Gastro-esophageal reflux disease without esophagitis: Secondary | ICD-10-CM

## 2021-10-20 LAB — CBC
HCT: 33.7 % — ABNORMAL LOW (ref 39.0–52.0)
Hemoglobin: 10.8 g/dL — ABNORMAL LOW (ref 13.0–17.0)
MCHC: 32.1 g/dL (ref 30.0–36.0)
MCV: 82 fl (ref 78.0–100.0)
Platelets: 217 10*3/uL (ref 150.0–400.0)
RBC: 4.1 Mil/uL — ABNORMAL LOW (ref 4.22–5.81)
RDW: 16.9 % — ABNORMAL HIGH (ref 11.5–15.5)
WBC: 5.4 10*3/uL (ref 4.0–10.5)

## 2021-10-20 LAB — BASIC METABOLIC PANEL
BUN: 17 mg/dL (ref 6–23)
CO2: 23 mEq/L (ref 19–32)
Calcium: 9.5 mg/dL (ref 8.4–10.5)
Chloride: 105 mEq/L (ref 96–112)
Creatinine, Ser: 0.99 mg/dL (ref 0.40–1.50)
GFR: 68.98 mL/min (ref >60.00)
Glucose, Bld: 235 mg/dL — ABNORMAL HIGH (ref 70–99)
Potassium: 4.1 mEq/L (ref 3.5–5.1)
Sodium: 138 mEq/L (ref 135–145)

## 2021-10-20 LAB — IBC + FERRITIN
Ferritin: 10 ng/mL — ABNORMAL LOW (ref 22.0–322.0)
Iron: 31 ug/dL — ABNORMAL LOW (ref 42–165)
Saturation Ratios: 8.5 % — ABNORMAL LOW (ref 20.0–50.0)
TIBC: 366.8 ug/dL (ref 250.0–450.0)
Transferrin: 262 mg/dL (ref 212.0–360.0)

## 2021-10-20 LAB — VITAMIN B12: Vitamin B-12: >1500 pg/mL — ABNORMAL HIGH (ref 211–911)

## 2021-10-20 NOTE — Progress Notes (Signed)
10/20/2021 Kevin Cole [id number] 1934-01-12   CHIEF COMPLAINT: Anemia   HISTORY OF PRESENT ILLNESS: Kevin Cole is an 88 year old male with a past medical history of arthritis, hypertension, hyperlipidemia, atrial fibrillation post cataract surgery 09/22/2021 on Eliquis, DM II, iron deficiency anemia, GERD and colon polyps. He presents to our office today as referred by Dr. Leanna Battles for further evaluation regarding anemia.   Labs 08/02/2021: WBC 5.99. Hg 11.4. HCT 35.6. MCV 85.4. Plt 245.  Labs 07/04/2021: Iron 349. Iron 29. Iron saturation 8%.  Labs 05/15/2021: B12 > 2000 Labs 05/31/2021: 1.06.   He reports feeling quite well.  He denies having any chest pain, shortness of breath, dizziness or fatigue.  He typically passes a normal brown bowel movement daily but his stools are darker brown in color since he started taking Poly-Iron 150 Forte once daily which she started a few months ago.  He denies passing any black stools.  No rectal bleeding.  He has a history of GERD for which she takes Omeprazole 20 mg daily for the past 10 years.  He has infrequent heartburn for which he takes Tums once every few weeks with relief.  He reported undergoing an EGD 20 years ago, his esophagus was stretched at that time.  No NSAID use.  His appetite has decreased and his taste sensation has diminished a bit.  He denies having any weight loss.  No fever, sweats or chills.  He underwent a colonoscopy by Dr. Fuller Plan 12/22/2008 which identified moderate diverticulosis in the sigmoid and descending colon, a 6 mm hyperplastic polyp was removed from the rectum and internal hemorrhoids were present.  He underwent a colonoscopy in 2004 2005 in [place] which she reported showed 1 benign polyp.  No known family history of esophageal, gastric or colon cancer.   ECHO 10/06/2021: 1. Left ventricular ejection fraction, by estimation, is 65 to 70%. The left ventricle has normal function. The  left ventricle has no regional wall motion abnormalities. The left ventricular internal cavity size was severely dilated. Left ventricular  diastolic parameters are indeterminate.   2. Right ventricular systolic function is normal. The right ventricular size is normal. There is normal pulmonary artery systolic pressure.   3. Left atrial size was severely dilated.   4. Right atrial size was severely dilated.   5. The mitral valve is normal in structure. Mild mitral valve regurgitation.   6. The aortic valve is tricuspid. Aortic valve regurgitation is mild.   7. Aortic dilatation noted. Aneurysm of the ascending aorta, measuring 47 mm.   8. The inferior vena cava is dilated in size with <50% respiratory variability, suggesting right atrial pressure of 15 mmHg  Past Medical History:  Diagnosis Date   Allergic rhinitis    Arthritis    LUMBAR - RECENT PHYSICAL THERAPY -WAS HAVING LOWER BACK PAIN   Atrial fibrillation (HCC)    Cardiac arrhythmia due to congenital heart disease    Colon polyps    type unknown   Diabetes ([place])    Diabetes mellitus, type 2 (HCC)    GERD (gastroesophageal reflux disease)    GERD (gastroesophageal reflux disease)    Hemorrhoids    History of kidney stones    Hyperlipidemia    Hypertension    Kidney stones    Past Surgical History:  Procedure Laterality Date   CYSTO FOR BLADDER STONE     CYSTOSCOPY WITH RETROGRADE PYELOGRAM, URETEROSCOPY AND STENT PLACEMENT Left 07/29/2012  Procedure: CYSTOSCOPY WITH LEFT RETROGRADE PYELOGRAM, LEFT URETEROSCOPY AND STENT PLACEMENT;  Surgeon: Claybon Jabs, MD;  Location: [facility];  Service: Urology;  Laterality: Left;   HOLMIUM LASER APPLICATION Left 9/37/9024   Procedure: HOLMIUM LASER APPLICATION with basket retrival of stone;  Surgeon: Claybon Jabs, MD;  Location: [facility];  Service: Urology;  Laterality: Left;   Social History: He is married.  He has 1 son and 1 daughter.  Retired.  He drinks 1 to 2 ounces of scotch  daily.  Family History: No known family history of esophageal, gastric or colon cancer  No Known Allergies    Outpatient Encounter Medications as of 10/20/2021  Medication Sig   apixaban (ELIQUIS) 5 MG TABS tablet Take 1 tablet (5 mg total) by mouth 2 (two) times daily.   atorvastatin (LIPITOR) 40 MG tablet 1 tablet every day by oral route.   Flaxseed, Linseed, (FLAX SEED OIL) 1000 MG CAPS Take 1 capsule by mouth 2 (two) times daily after a meal.    fluticasone (FLONASE) 50 MCG/ACT nasal spray    glimepiride (AMARYL) 4 MG tablet    glucose blood test strip OneTouch Ultra Blue Test Strip   hydrocortisone (ANUSOL-HC) 2.5 % rectal cream Place 1 application rectally 2 (two) times daily as needed for hemorrhoids.    irbesartan (AVAPRO) 300 MG tablet Take 300 mg by mouth every morning.   Lancets (ONETOUCH DELICA PLUS [id number]) MISC SMARTSIG:1 Topical Daily   loratadine (CLARITIN) 10 MG tablet Take by mouth.   metFORMIN (GLUCOPHAGE) 500 MG tablet Take 1,000 mg by mouth 2 (two) times daily with a meal.    metoprolol succinate (TOPROL-XL) 25 MG 24 hr tablet Take 12.5 mg by mouth daily.   Multiple Vitamin (MULTIVITAMIN) tablet Take 1 tablet by mouth daily.    omeprazole (PRILOSEC) 10 MG capsule Take 10 mg by mouth daily.   POLY-IRON 150 FORTE 150-25-1 MG-MCG-MG CAPS Take 1 capsule by mouth daily.   tamsulosin (FLOMAX) 0.4 MG CAPS capsule    co-enzyme Q-10 50 MG capsule Take 50 mg by mouth daily.  (Patient not taking: Reported on 10/20/2021)   cyanocobalamin 100 MCG tablet Take by mouth. (Patient not taking: Reported on 10/20/2021)   No facility-administered encounter medications on file as of 10/20/2021.   REVIEW OF SYSTEMS:  Gen: Denies fever, sweats or chills. No weight loss.  CV: Denies chest pain, palpitations or edema. Resp: Denies cough, shortness of breath of hemoptysis.  GI: Denies heartburn, dysphagia, stomach or lower abdominal pain. No diarrhea or constipation.  GU : Denies urinary  burning, blood in urine, increased urinary frequency or incontinence. MS: + Back pain.  Derm: Denies rash, itchiness, skin lesions or unhealing ulcers. Psych: Denies depression, anxiety or memory loss. Heme: Denies bruising, easy bleeding. Neuro:  Denies headaches, dizziness or paresthesias. Endo:  + DM II.  PHYSICAL EXAM: BP (!) 146/72   Pulse (!) 55   Ht '5\' 10"'$  (1.778 m)   Wt 160 lb (72.6 kg)   BMI 22.96 kg/m  Wt Readings from Last 3 Encounters:  10/20/21 160 lb (72.6 kg)  09/27/21 157 lb 6.4 oz (71.4 kg)  08/29/17 166 lb 6.4 oz (75.5 kg)    General: 88 year old male slightly pale complected in no acute distress. Head: Normocephalic and atraumatic. Eyes:  Sclerae non-icteric, conjunctive pink. Ears: Normal auditory acuity. Mouth: Dentition intact. No ulcers or lesions.  Neck: Supple, no lymphadenopathy or thyromegaly.  Lungs: Clear bilaterally to auscultation without wheezes, crackles or rhonchi. Heart:  Regular rate and rhythm. No murmur, rub or gallop appreciated.  Abdomen: Soft, nontender, non distended. No masses. No hepatosplenomegaly. Normoactive bowel sounds x 4 quadrants.  Rectal: Deferred. Musculoskeletal: Symmetrical with no gross deformities. Skin: Warm and dry. No rash or lesions on visible extremities. Extremities: No edema. Neurological: Alert oriented x 4, no focal deficits.  Psychological:  Alert and cooperative. Normal mood and affect.  ASSESSMENT AND PLAN:  79) 88 year old male with iron deficiency anemia, asymptomatic.  Continue Poly-Iron 150 Forte one tab daily. -CBC, IBC + Ferritin and B12 level -EGD +/- colonoscopy discussed with the patient, benefits and risks discussed including risk with sedation, risk of bleeding, perforation and infection  -Patient prefers to review today's lab results prior to making any decision regarding scheduling an EGD and colonoscopy -Patient to present to the ED if he develops significant black stools or rectal  bleeding -Cardiac clearance required prior to scheduling endoscopic evaluation in setting of recent diagnosis of atrial fibrillation. To consider a diagnostic EGD +/- colonoscopy if Eliquis cannot be held  2) Atrial fibrillation which occurred status post cataract surgery 09/22/2021 on Eliquis. CHADS2VASC score of 4.  3) History of GERD -Continue omeprazole 10 mg once daily for now  4) History of a rectal hyperplastic polyp per colonoscopy 12/2008.  Reported history of colon polyps per colonoscopy 2004 2005, records not available.    CC:  Donnajean Lopes, MD

## 2021-10-20 NOTE — Patient Instructions (Signed)
Your provider has requested that you go to the basement level for lab work before leaving today. Press "B" on the elevator. The lab is located at the first door on the left as you exit the elevator.   Further follow up to be determined after lab results reviewed.   Go to the ED if your develop chest pain, shortness of breath, or profound fatigue. _______________________________________________________  If you are age 86 or older, your body mass index should be between 23-30. Your Body mass index is 22.96 kg/m. If this is out of the aforementioned range listed, please consider follow up with your Primary Care Provider.  If you are age 63 or younger, your body mass index should be between 19-25. Your Body mass index is 22.96 kg/m. If this is out of the aformentioned range listed, please consider follow up with your Primary Care Provider.   ________________________________________________________  The [facility] providers would like to encourage you to use [facility] to communicate with providers for non-urgent requests or questions.  Due to long hold times on the telephone, sending your provider a message by [facility] may be a faster and more efficient way to get a response.  Please allow 48 business hours for a response.  Please remember that this is for non-urgent requests.  _______________________________________________________

## 2021-10-21 ENCOUNTER — Other Ambulatory Visit: Payer: Self-pay

## 2021-10-21 ENCOUNTER — Telehealth: Payer: Self-pay

## 2021-10-21 DIAGNOSIS — D649 Anemia, unspecified: Secondary | ICD-10-CM

## 2021-10-21 DIAGNOSIS — D509 Iron deficiency anemia, unspecified: Secondary | ICD-10-CM

## 2021-10-21 MED ORDER — FERROUS SULFATE 325 (65 FE) MG PO TABS: 325.0000 mg | ORAL_TABLET | Freq: Every day | ORAL | 2 refills | Status: AC

## 2021-10-21 MED ORDER — FERROUS SULFATE 325 (65 FE) MG PO TABS: 325.0000 mg | ORAL_TABLET | Freq: Every day | ORAL | 2 refills | Status: DC

## 2021-10-21 NOTE — Telephone Encounter (Signed)
[facility] Pre-operative Risk Assessment     Kevin Cole Jun 19, 1933 [id number]  Procedure: Upper Endoscopy/ Colonoscopy Anesthesia type:  MAC Procedure Date: To Be Determined Provider: Dr. Fuller Plan   Type of Clearance needed: Pharmacy/Cardiac  Medication(s) needing held: Eliquis ( please advise) also is Lovenox Bridge is required for potential EGD/ Colonoscopy   Length of time for medication to be held: 2  Please review request and advise by either responding to this message or by sending your response to the fax # provided below.  Thank you,  [facility]  Phone: [phone] Fax: [phone] ATTENTION: Gillermina Hu RN

## 2021-10-26 NOTE — Telephone Encounter (Signed)
Patient with diagnosis of atrial fibrillation on Elquis for anticoagulation.    Procedure: endoscopy/colonoscopy Date of procedure: TBD   CHA2DS2-VASc Score = 4   This indicates a 4.8% annual risk of stroke. The patient's score is based upon: CHF History: 0 HTN History: 1 Diabetes History: 1 Stroke History: 0 Vascular Disease History: 0 Age Score: 2 Gender Score: 0   CrCl 55 Platelet count 217  Per office protocol, patient can hold Eliquis for 2 days prior to procedure.   Patient will not need bridging with Lovenox (enoxaparin) around procedure.  **This guidance is not considered finalized until pre-operative APP has relayed final recommendations.**

## 2021-10-26 NOTE — Telephone Encounter (Signed)
   Patient Name: Kevin Cole  DOB: 1933-07-28 MRN: [id number]  Primary Cardiologist: None  Clinical pharmacists have reviewed the patient's past medical history, labs, and current medications as part of preoperative protocol coverage. The following recommendations have been made:  Patient with diagnosis of atrial fibrillation on Elquis for anticoagulation.     Procedure: endoscopy/colonoscopy Date of procedure: TBD     CHA2DS2-VASc Score = 4   This indicates a 4.8% annual risk of stroke. The patient's score is based upon: CHF History: 0 HTN History: 1 Diabetes History: 1 Stroke History: 0 Vascular Disease History: 0 Age Score: 2 Gender Score: 0     CrCl 55 Platelet count 217   Per office protocol, patient can hold Eliquis for 2 days prior to procedure.   Patient will not need bridging with Lovenox (enoxaparin) around procedure.  I will route this recommendation to the requesting party via Epic fax function and remove from pre-op pool.  Please call with questions.  Lenna Sciara, NP 10/26/2021, 11:27 AM

## 2021-10-28 ENCOUNTER — Other Ambulatory Visit (INDEPENDENT_AMBULATORY_CARE_PROVIDER_SITE_OTHER): Payer: PPO

## 2021-10-28 ENCOUNTER — Encounter (HOSPITAL_COMMUNITY): Payer: Self-pay | Admitting: Physician Assistant

## 2021-10-28 ENCOUNTER — Ambulatory Visit (HOSPITAL_COMMUNITY)
Admission: RE | Admit: 2021-10-28 | Discharge: 2021-10-28 | Disposition: A | Discharge status: Home / Self Care | Payer: PPO | Source: Ambulatory Visit | Attending: Physician Assistant | Admitting: Physician Assistant

## 2021-10-28 VITALS — BP 136/76 | HR 51 | Ht 70.0 in | Wt 158.4 lb

## 2021-10-28 DIAGNOSIS — K8020 Calculus of gallbladder without cholecystitis without obstruction: Secondary | ICD-10-CM | POA: Diagnosis not present

## 2021-10-28 DIAGNOSIS — Z7901 Long term (current) use of anticoagulants: Secondary | ICD-10-CM | POA: Insufficient documentation

## 2021-10-28 DIAGNOSIS — E785 Hyperlipidemia, unspecified: Secondary | ICD-10-CM | POA: Insufficient documentation

## 2021-10-28 DIAGNOSIS — E119 Type 2 diabetes mellitus without complications: Secondary | ICD-10-CM | POA: Diagnosis not present

## 2021-10-28 DIAGNOSIS — I4819 Other persistent atrial fibrillation: Secondary | ICD-10-CM | POA: Insufficient documentation

## 2021-10-28 DIAGNOSIS — D509 Iron deficiency anemia, unspecified: Secondary | ICD-10-CM | POA: Diagnosis not present

## 2021-10-28 DIAGNOSIS — I10 Essential (primary) hypertension: Secondary | ICD-10-CM | POA: Diagnosis not present

## 2021-10-28 DIAGNOSIS — Z79899 Other long term (current) drug therapy: Secondary | ICD-10-CM | POA: Insufficient documentation

## 2021-10-28 DIAGNOSIS — D6869 Other thrombophilia: Secondary | ICD-10-CM | POA: Diagnosis not present

## 2021-10-28 DIAGNOSIS — K5730 Diverticulosis of large intestine without perforation or abscess without bleeding: Secondary | ICD-10-CM | POA: Insufficient documentation

## 2021-10-28 DIAGNOSIS — K449 Diaphragmatic hernia without obstruction or gangrene: Secondary | ICD-10-CM | POA: Insufficient documentation

## 2021-10-28 DIAGNOSIS — D649 Anemia, unspecified: Secondary | ICD-10-CM | POA: Diagnosis not present

## 2021-10-28 LAB — CBC WITH DIFFERENTIAL/PLATELET
Basophils Absolute: 0.1 10*3/uL (ref 0.0–0.1)
Basophils Relative: 0.9 % (ref 0.0–3.0)
Eosinophils Absolute: 0.3 10*3/uL (ref 0.0–0.7)
Eosinophils Relative: 4 % (ref 0.0–5.0)
HCT: 33.2 % — ABNORMAL LOW (ref 39.0–52.0)
Hemoglobin: 10.8 g/dL — ABNORMAL LOW (ref 13.0–17.0)
Lymphocytes Relative: 22.7 % (ref 12.0–46.0)
Lymphs Abs: 1.5 10*3/uL (ref 0.7–4.0)
MCHC: 32.5 g/dL (ref 30.0–36.0)
MCV: 81.4 fl (ref 78.0–100.0)
Monocytes Absolute: 0.6 10*3/uL (ref 0.1–1.0)
Monocytes Relative: 9.5 % (ref 3.0–12.0)
Neutro Abs: 4.1 10*3/uL (ref 1.4–7.7)
Neutrophils Relative %: 62.9 % (ref 43.0–77.0)
Platelets: 219 10*3/uL (ref 150.0–400.0)
RBC: 4.07 Mil/uL — ABNORMAL LOW (ref 4.22–5.81)
RDW: 16.9 % — ABNORMAL HIGH (ref 11.5–15.5)
WBC: 6.5 10*3/uL (ref 4.0–10.5)

## 2021-10-28 NOTE — Progress Notes (Signed)
Primary Care Physician: Kevin Lopes, MD Primary Cardiologist: none Primary Electrophysiologist: none Referring Physician: Dr Simonne Come is a 88 y.o. male with a history of HLD, HTN, DM, iron deficiency anemia, atrial fibrillation who presents for follow up in the [facility].  The patient was initially diagnosed with atrial fibrillation 09/22/21 after having cataract surgery. His pulse was noted to be irregular and an ECG showed rate controlled afib, PCP referred him to AF clinic. Patient has a CHADS2VASC score of 4. He states that he is unaware of his afib with no associated symptoms. He has noted an irregular heart beat on his BP machine which has been ongoing for several years.   On follow up today, patient reports that he has done well since his last visit. He continues to deny any awareness of his arrhythmia. He is currently undergoing workup with GI to evaluate anemia.   Today, he denies symptoms of palpitations, chest pain, shortness of breath, orthopnea, PND, lower extremity edema, dizziness, presyncope, syncope, snoring, daytime somnolence, bleeding, or neurologic sequela. The patient is tolerating medications without difficulties and is otherwise without complaint today.    Atrial Fibrillation Risk Factors:  he does not have symptoms or diagnosis of sleep apnea. he does not have a history of rheumatic fever.   he has a BMI of Body mass index is 22.73 kg/m.Marland Kitchen Filed Weights   10/28/21 0958  Weight: 71.8 kg    Family History  Problem Relation Age of Onset   Diabetes Father    Lung cancer Other    Alcohol abuse Other    Diabetes Paternal Aunt    Colon cancer Neg Hx      Atrial Fibrillation Management history:  Previous antiarrhythmic drugs: none Previous cardioversions: none Previous ablations: none CHADS2VASC score: 4 Anticoagulation history: Eliquis   Past Medical History:  Diagnosis Date   Allergic  rhinitis    Arthritis    LUMBAR - RECENT PHYSICAL THERAPY -WAS HAVING LOWER BACK PAIN   Atrial fibrillation (HCC)    Cardiac arrhythmia due to congenital heart disease    Colon polyps    type unknown   Diabetes (HCC)    Diabetes mellitus, type 2 (HCC)    GERD (gastroesophageal reflux disease)    GERD (gastroesophageal reflux disease)    Hemorrhoids    History of kidney stones    Hyperlipidemia    Hypertension    Kidney stones    Past Surgical History:  Procedure Laterality Date   CYSTO FOR BLADDER STONE     CYSTOSCOPY WITH RETROGRADE PYELOGRAM, URETEROSCOPY AND STENT PLACEMENT Left 07/29/2012   Procedure: CYSTOSCOPY WITH LEFT RETROGRADE PYELOGRAM, LEFT URETEROSCOPY AND STENT PLACEMENT;  Surgeon: Claybon Jabs, MD;  Location: [facility];  Service: Urology;  Laterality: Left;   HOLMIUM LASER APPLICATION Left 3/55/7322   Procedure: HOLMIUM LASER APPLICATION with basket retrival of stone;  Surgeon: Claybon Jabs, MD;  Location: [facility];  Service: Urology;  Laterality: Left;    Current Outpatient Medications  Medication Sig Dispense Refill   apixaban (ELIQUIS) 5 MG TABS tablet Take 1 tablet (5 mg total) by mouth 2 (two) times daily. 60 tablet 11   atorvastatin (LIPITOR) 40 MG tablet 1 tablet every day by oral route.     co-enzyme Q-10 50 MG capsule Take 50 mg by mouth daily.     ferrous sulfate 325 (65 FE) MG tablet Take 1 tablet (325 mg total) by mouth daily with  breakfast. 30 tablet 2   Flaxseed, Linseed, (FLAX SEED OIL) 1000 MG CAPS Take 1 capsule by mouth 2 (two) times daily after a meal.      fluticasone (FLONASE) 50 MCG/ACT nasal spray      glimepiride (AMARYL) 4 MG tablet      glucose blood test strip OneTouch Ultra Blue Test Strip     hydrocortisone (ANUSOL-HC) 2.5 % rectal cream Place 1 application rectally 2 (two) times daily as needed for hemorrhoids.      irbesartan (AVAPRO) 300 MG tablet Take 300 mg by mouth every morning.     Lancets (ONETOUCH DELICA PLUS [id number]) MISC  SMARTSIG:1 Topical Daily     loratadine (CLARITIN) 10 MG tablet Take by mouth.     metFORMIN (GLUCOPHAGE) 500 MG tablet Take 1,000 mg by mouth 2 (two) times daily with a meal.      metoprolol succinate (TOPROL-XL) 25 MG 24 hr tablet Take 12.5 mg by mouth daily.     Multiple Vitamin (MULTIVITAMIN) tablet Take 1 tablet by mouth daily.      omeprazole (PRILOSEC) 10 MG capsule Take 10 mg by mouth daily.     POLY-IRON 150 FORTE 150-25-1 MG-MCG-MG CAPS Take 1 capsule by mouth daily.     tamsulosin (FLOMAX) 0.4 MG CAPS capsule      No current facility-administered medications for this encounter.    No Known Allergies  Social History   Socioeconomic History   Marital status: Married    Spouse name: Not on file   Number of children: 2   Years of education: Not on file   Highest education level: Not on file  Occupational History   Occupation: Retired   Occupation: retired  Tobacco Use   Smoking status: Former   Smokeless tobacco: Never   Tobacco comments:    Former smoker 09/27/21  Vaping Use   Vaping Use: Never used  Substance and Sexual Activity   Alcohol use: Yes    Alcohol/week: 10.0 standard drinks of alcohol    Types: 10 Shots of liquor per week    Comment: 1-2 shots daily 09/27/21   Drug use: No   Sexual activity: Not on file  Other Topics Concern   Not on file  Social History Narrative   ** Merged History Encounter **       Daily caffeine use   Social Determinants of Health   Financial Resource Strain: Not on file  Food Insecurity: Not on file  Transportation Needs: Not on file  Physical Activity: Not on file  Stress: Not on file  Social Connections: Not on file  Intimate Partner Violence: Not on file     ROS- All systems are reviewed and negative except as per the HPI above.  Physical Exam: Vitals:   10/28/21 0958  BP: 136/76  Pulse: (!) 51  Weight: 71.8 kg  Height: '5\' 10"'$  (1.778 m)     GEN- The patient is a well appearing elderly male, alert and  oriented x 3 today.   HEENT-head normocephalic, atraumatic, sclera clear, conjunctiva pink, hearing intact, trachea midline. Lungs- Clear to ausculation bilaterally, normal work of breathing Heart- irregular rate and rhythm, no murmurs, rubs or gallops  GI- soft, NT, ND, + BS Extremities- no clubbing, cyanosis, or edema MS- no significant deformity or atrophy Skin- no rash or lesion Psych- euthymic mood, full affect Neuro- strength and sensation are intact   Wt Readings from Last 3 Encounters:  10/28/21 71.8 kg  10/20/21 72.6 kg  09/27/21  71.4 kg    EKG today demonstrates  Afib with slow V rates Vent. rate 51 BPM PR interval * ms QRS duration 78 ms QT/QTcB 446/411 ms   Echo 10/06/21  1. Left ventricular ejection fraction, by estimation, is 65 to 70%. The  left ventricle has normal function. The left ventricle has no regional  wall motion abnormalities. The left ventricular internal cavity size was  severely dilated. Left ventricular diastolic parameters are indeterminate.   2. Right ventricular systolic function is normal. The right ventricular  size is normal. There is normal pulmonary artery systolic pressure.   3. Left atrial size was severely dilated.   4. Right atrial size was severely dilated.   5. The mitral valve is normal in structure. Mild mitral valve  regurgitation.   6. The aortic valve is tricuspid. Aortic valve regurgitation is mild.   7. Aortic dilatation noted. Aneurysm of the ascending aorta, measuring 47 mm.   8. The inferior vena cava is dilated in size with <50% respiratory  variability, suggesting right atrial pressure of 15 mmHg.    Epic records are reviewed at length today  CHA2DS2-VASc Score = 4  The patient's score is based upon: CHF History: 0 HTN History: 1 Diabetes History: 1 Stroke History: 0 Vascular Disease History: 0 Age Score: 2 Gender Score: 0       ASSESSMENT AND PLAN: 1. Persistent Atrial Fibrillation The patient's  CHA2DS2-VASc score is 4, indicating a 4.8% annual risk of stroke.   Unclear duration, possibly longstanding with severe biatrial enlargement. We discussed rate vs rhythm control again today. Given his age, paucity of symptoms, preserved EF, and severe biatrial enlargement he would like to pursue a rate control strategy for now.  Continue Eliquis 5 mg BID for now. Plans for colonoscopy noted.  Continue Toprol 12.5 mg daily  2. Secondary Hypercoagulable State (ICD10:  D68.69) The patient is at significant risk for stroke/thromboembolism based upon his CHA2DS2-VASc Score of 4.  Continue Apixaban (Eliquis).   3. HTN Stable, no changes today.   Follow up in the AF clinic as need. Will refer to establish care with a primary cardiologist.    Adline Peals PA-C [facility] [address] [place], [place] [address] [phone] 10/28/2021 10:03 AM

## 2021-10-29 ENCOUNTER — Ambulatory Visit (HOSPITAL_COMMUNITY)
Admission: RE | Admit: 2021-10-29 | Discharge: 2021-10-29 | Disposition: A | Discharge status: Home / Self Care | Payer: PPO | Source: Ambulatory Visit | Attending: Gastroenterology | Admitting: Gastroenterology

## 2021-10-29 DIAGNOSIS — D649 Anemia, unspecified: Secondary | ICD-10-CM

## 2021-10-29 DIAGNOSIS — N281 Cyst of kidney, acquired: Secondary | ICD-10-CM | POA: Diagnosis not present

## 2021-10-29 DIAGNOSIS — I4819 Other persistent atrial fibrillation: Secondary | ICD-10-CM | POA: Diagnosis not present

## 2021-10-29 DIAGNOSIS — D509 Iron deficiency anemia, unspecified: Secondary | ICD-10-CM

## 2021-10-29 DIAGNOSIS — R1030 Lower abdominal pain, unspecified: Secondary | ICD-10-CM | POA: Diagnosis not present

## 2021-10-29 DIAGNOSIS — K5730 Diverticulosis of large intestine without perforation or abscess without bleeding: Secondary | ICD-10-CM | POA: Diagnosis not present

## 2021-10-29 MED ORDER — IOHEXOL 300 MG/ML  SOLN
100.0000 mL | Freq: Once | INTRAMUSCULAR | Status: AC | PRN
  Administered 2021-10-29: 100 mL via INTRAVENOUS

## 2021-12-07 DIAGNOSIS — R972 Elevated prostate specific antigen [PSA]: Secondary | ICD-10-CM | POA: Diagnosis not present

## 2021-12-07 DIAGNOSIS — N401 Enlarged prostate with lower urinary tract symptoms: Secondary | ICD-10-CM | POA: Diagnosis not present

## 2021-12-07 DIAGNOSIS — N200 Calculus of kidney: Secondary | ICD-10-CM | POA: Diagnosis not present

## 2021-12-07 DIAGNOSIS — R3912 Poor urinary stream: Secondary | ICD-10-CM | POA: Diagnosis not present

## 2021-12-19 DIAGNOSIS — M47896 Other spondylosis, lumbar region: Secondary | ICD-10-CM | POA: Diagnosis not present

## 2021-12-19 DIAGNOSIS — M5416 Radiculopathy, lumbar region: Secondary | ICD-10-CM | POA: Diagnosis not present

## 2021-12-28 DIAGNOSIS — M5416 Radiculopathy, lumbar region: Secondary | ICD-10-CM | POA: Diagnosis not present

## 2022-01-03 DIAGNOSIS — E785 Hyperlipidemia, unspecified: Secondary | ICD-10-CM | POA: Diagnosis not present

## 2022-01-03 DIAGNOSIS — I739 Peripheral vascular disease, unspecified: Secondary | ICD-10-CM | POA: Diagnosis not present

## 2022-01-03 DIAGNOSIS — Z23 Encounter for immunization: Secondary | ICD-10-CM | POA: Diagnosis not present

## 2022-01-03 DIAGNOSIS — E1151 Type 2 diabetes mellitus with diabetic peripheral angiopathy without gangrene: Secondary | ICD-10-CM | POA: Diagnosis not present

## 2022-01-03 DIAGNOSIS — D508 Other iron deficiency anemias: Secondary | ICD-10-CM | POA: Diagnosis not present

## 2022-01-03 DIAGNOSIS — I10 Essential (primary) hypertension: Secondary | ICD-10-CM | POA: Diagnosis not present

## 2022-01-03 DIAGNOSIS — M5416 Radiculopathy, lumbar region: Secondary | ICD-10-CM | POA: Diagnosis not present

## 2022-01-03 DIAGNOSIS — I4891 Unspecified atrial fibrillation: Secondary | ICD-10-CM | POA: Diagnosis not present

## 2022-01-03 DIAGNOSIS — R3911 Hesitancy of micturition: Secondary | ICD-10-CM | POA: Diagnosis not present

## 2022-01-12 DIAGNOSIS — M5416 Radiculopathy, lumbar region: Secondary | ICD-10-CM | POA: Diagnosis not present

## 2022-01-12 DIAGNOSIS — E1140 Type 2 diabetes mellitus with diabetic neuropathy, unspecified: Secondary | ICD-10-CM | POA: Diagnosis not present

## 2022-01-12 DIAGNOSIS — M47896 Other spondylosis, lumbar region: Secondary | ICD-10-CM | POA: Diagnosis not present

## 2022-04-03 DIAGNOSIS — L720 Epidermal cyst: Secondary | ICD-10-CM | POA: Diagnosis not present

## 2022-04-03 DIAGNOSIS — L570 Actinic keratosis: Secondary | ICD-10-CM | POA: Diagnosis not present

## 2022-04-03 DIAGNOSIS — D225 Melanocytic nevi of trunk: Secondary | ICD-10-CM | POA: Diagnosis not present

## 2022-04-03 DIAGNOSIS — Z85828 Personal history of other malignant neoplasm of skin: Secondary | ICD-10-CM | POA: Diagnosis not present

## 2022-04-03 DIAGNOSIS — L821 Other seborrheic keratosis: Secondary | ICD-10-CM | POA: Diagnosis not present

## 2022-04-03 DIAGNOSIS — D1801 Hemangioma of skin and subcutaneous tissue: Secondary | ICD-10-CM | POA: Diagnosis not present

## 2022-04-25 DIAGNOSIS — I739 Peripheral vascular disease, unspecified: Secondary | ICD-10-CM | POA: Diagnosis not present

## 2022-04-25 DIAGNOSIS — I10 Essential (primary) hypertension: Secondary | ICD-10-CM | POA: Diagnosis not present

## 2022-04-25 DIAGNOSIS — E1151 Type 2 diabetes mellitus with diabetic peripheral angiopathy without gangrene: Secondary | ICD-10-CM | POA: Diagnosis not present

## 2022-04-25 DIAGNOSIS — I4891 Unspecified atrial fibrillation: Secondary | ICD-10-CM | POA: Diagnosis not present

## 2022-05-08 DIAGNOSIS — Z85828 Personal history of other malignant neoplasm of skin: Secondary | ICD-10-CM | POA: Diagnosis not present

## 2022-05-08 DIAGNOSIS — L720 Epidermal cyst: Secondary | ICD-10-CM | POA: Diagnosis not present

## 2022-05-18 DIAGNOSIS — L720 Epidermal cyst: Secondary | ICD-10-CM | POA: Diagnosis not present

## 2022-05-18 DIAGNOSIS — Z85828 Personal history of other malignant neoplasm of skin: Secondary | ICD-10-CM | POA: Diagnosis not present

## 2022-05-18 DIAGNOSIS — L309 Dermatitis, unspecified: Secondary | ICD-10-CM | POA: Diagnosis not present

## 2022-05-29 DIAGNOSIS — R972 Elevated prostate specific antigen [PSA]: Secondary | ICD-10-CM | POA: Diagnosis not present

## 2022-06-05 DIAGNOSIS — N200 Calculus of kidney: Secondary | ICD-10-CM | POA: Diagnosis not present

## 2022-06-05 DIAGNOSIS — N401 Enlarged prostate with lower urinary tract symptoms: Secondary | ICD-10-CM | POA: Diagnosis not present

## 2022-06-05 DIAGNOSIS — R3912 Poor urinary stream: Secondary | ICD-10-CM | POA: Diagnosis not present

## 2022-10-04 DIAGNOSIS — M7061 Trochanteric bursitis, right hip: Secondary | ICD-10-CM | POA: Diagnosis not present

## 2022-10-05 ENCOUNTER — Other Ambulatory Visit (HOSPITAL_COMMUNITY): Payer: Self-pay | Admitting: Physician Assistant

## 2022-10-23 DIAGNOSIS — M25511 Pain in right shoulder: Secondary | ICD-10-CM | POA: Diagnosis not present

## 2022-10-23 DIAGNOSIS — M25551 Pain in right hip: Secondary | ICD-10-CM | POA: Diagnosis not present

## 2022-10-31 DIAGNOSIS — Z961 Presence of intraocular lens: Secondary | ICD-10-CM | POA: Diagnosis not present

## 2022-10-31 DIAGNOSIS — E113292 Type 2 diabetes mellitus with mild nonproliferative diabetic retinopathy without macular edema, left eye: Secondary | ICD-10-CM | POA: Diagnosis not present

## 2022-11-09 DIAGNOSIS — E1151 Type 2 diabetes mellitus with diabetic peripheral angiopathy without gangrene: Secondary | ICD-10-CM | POA: Diagnosis not present

## 2022-11-09 DIAGNOSIS — D509 Iron deficiency anemia, unspecified: Secondary | ICD-10-CM | POA: Diagnosis not present

## 2022-11-09 DIAGNOSIS — R972 Elevated prostate specific antigen [PSA]: Secondary | ICD-10-CM | POA: Diagnosis not present

## 2022-11-09 DIAGNOSIS — I10 Essential (primary) hypertension: Secondary | ICD-10-CM | POA: Diagnosis not present

## 2022-11-09 DIAGNOSIS — E785 Hyperlipidemia, unspecified: Secondary | ICD-10-CM | POA: Diagnosis not present

## 2022-11-10 ENCOUNTER — Other Ambulatory Visit (HOSPITAL_COMMUNITY): Payer: Self-pay | Admitting: Physician Assistant

## 2022-11-14 ENCOUNTER — Other Ambulatory Visit (HOSPITAL_COMMUNITY): Payer: Self-pay | Admitting: Physician Assistant

## 2022-11-16 DIAGNOSIS — Z0000 Encounter for general adult medical examination without abnormal findings: Secondary | ICD-10-CM | POA: Diagnosis not present

## 2022-11-16 DIAGNOSIS — Z23 Encounter for immunization: Secondary | ICD-10-CM | POA: Diagnosis not present

## 2022-11-16 DIAGNOSIS — I4891 Unspecified atrial fibrillation: Secondary | ICD-10-CM | POA: Diagnosis not present

## 2022-11-16 DIAGNOSIS — E1151 Type 2 diabetes mellitus with diabetic peripheral angiopathy without gangrene: Secondary | ICD-10-CM | POA: Diagnosis not present

## 2022-11-16 DIAGNOSIS — E785 Hyperlipidemia, unspecified: Secondary | ICD-10-CM | POA: Diagnosis not present

## 2022-11-16 DIAGNOSIS — N200 Calculus of kidney: Secondary | ICD-10-CM | POA: Diagnosis not present

## 2022-11-16 DIAGNOSIS — I739 Peripheral vascular disease, unspecified: Secondary | ICD-10-CM | POA: Diagnosis not present

## 2022-11-16 DIAGNOSIS — I10 Essential (primary) hypertension: Secondary | ICD-10-CM | POA: Diagnosis not present

## 2022-11-16 DIAGNOSIS — Z13828 Encounter for screening for other musculoskeletal disorder: Secondary | ICD-10-CM | POA: Diagnosis not present

## 2022-12-04 DIAGNOSIS — N401 Enlarged prostate with lower urinary tract symptoms: Secondary | ICD-10-CM | POA: Diagnosis not present

## 2022-12-04 DIAGNOSIS — R972 Elevated prostate specific antigen [PSA]: Secondary | ICD-10-CM | POA: Diagnosis not present

## 2022-12-04 DIAGNOSIS — N3943 Post-void dribbling: Secondary | ICD-10-CM | POA: Diagnosis not present

## 2022-12-04 DIAGNOSIS — N200 Calculus of kidney: Secondary | ICD-10-CM | POA: Diagnosis not present

## 2022-12-11 ENCOUNTER — Telehealth (HOSPITAL_COMMUNITY): Payer: Self-pay

## 2022-12-11 ENCOUNTER — Other Ambulatory Visit (HOSPITAL_COMMUNITY): Payer: Self-pay | Admitting: Physician Assistant

## 2022-12-11 DIAGNOSIS — I4891 Unspecified atrial fibrillation: Secondary | ICD-10-CM

## 2022-12-11 NOTE — Telephone Encounter (Signed)
Reached out to patient about his Eliquis medication. Patient was told he needs to establish care with cardiology. Placed 2nd referral for patient. Instructed patient to make sure he schedules appointment once they give him a call. Consulted with patient and he verbalized understanding.

## 2023-02-01 ENCOUNTER — Ambulatory Visit: Payer: HMO | Attending: Cardiology | Admitting: Cardiology

## 2023-02-01 ENCOUNTER — Encounter: Payer: Self-pay | Admitting: Cardiology

## 2023-02-01 VITALS — BP 132/88 | HR 51 | Resp 16 | Ht 70.0 in | Wt 167.6 lb

## 2023-02-01 DIAGNOSIS — I10 Essential (primary) hypertension: Secondary | ICD-10-CM | POA: Diagnosis not present

## 2023-02-01 DIAGNOSIS — I4821 Permanent atrial fibrillation: Secondary | ICD-10-CM | POA: Diagnosis not present

## 2023-02-01 NOTE — Patient Instructions (Signed)
Medication Instructions:  Your physician recommends that you continue on your current medications as directed. Please refer to the Current Medication list given to you today.  *If you need a refill on your cardiac medications before your next appointment, please call your pharmacy*   Lab Work: none If you have labs (blood work) drawn today and your tests are completely normal, you will receive your results only by: MyChart Message (if you have MyChart) OR A paper copy in the mail If you have any lab test that is abnormal or we need to change your treatment, we will call you to review the results.   Testing/Procedures: none   Follow-Up: At [facility], you and your health needs are our priority.  As part of our continuing mission to provide you with exceptional heart care, we have created designated Provider Care Teams.  These Care Teams include your primary Cardiologist (physician) and Advanced Practice Providers (APPs -  Physician Assistants and Nurse Practitioners) who all work together to provide you with the care you need, when you need it.  We recommend signing up for the patient portal called "MyChart".  Sign up information is provided on this After Visit Summary.  MyChart is used to connect with patients for Virtual Visits (Telemedicine).  Patients are able to view lab/test results, encounter notes, upcoming appointments, etc.  Non-urgent messages can be sent to your provider as well.   To learn more about what you can do with MyChart, go to [email or web address].    Your next appointment:   As needed  Provider:   Yates Decamp, MD     Other Instructions

## 2023-02-01 NOTE — Progress Notes (Signed)
Cardiology Office Note:  .   Date:  02/01/2023  ID:  Kevin Cole, DOB September 24, 1933, MRN [id number] PCP: Garlan Fillers, MD  [facility] Providers Cardiologist:  Yates Decamp, MD   History of Present Illness: .   Kevin Cole is a 89 y.o.  male with a history of HLD, HTN, DM, iron deficiency anemia, colonic polyps, atrial fibrillation who presents for follow up of permanent atrial fibrillation.  The patient was initially diagnosed with atrial fibrillation 09/22/21 after having cataract surgery.   Discussed the use of AI scribe software for clinical note transcription with the patient, who gave verbal consent to proceed.  History of Present Illness   Kevin Cole, an 89 year old patient with a history of atrial fibrillation (AFib), diabetes, and mild anemia, presents for a routine follow-up. He was diagnosed with AFib during a cataract surgery and has been well-controlled since. He reports no chest pain, shortness of breath, or passing out spells. He has been under significant stress due to a recent move to a retirement home and his wife's severe hearing loss. His wife's hearing loss has been progressive, with one ear affected by a past episode of meningitis and the other gradually deteriorating. Despite these challenges, the patient reports feeling fine from a cardiac standpoint. He has been taking his medications as prescribed, including Eliquis, irbesartan, metoprolol, insulin, and finasteride. He has mild anemia, which has been stable, and he is taking iron supplements for it. He has declined further GI workup for the anemia, opting to monitor his blood count and stool color.      Review of Systems  Cardiovascular:  Negative for chest pain, dyspnea on exertion and leg swelling.   Labs   Lab Results  Component Value Date   NA 138 10/20/2021   K 4.1 10/20/2021   CO2 23 10/20/2021   GLUCOSE 235 (H) 10/20/2021   BUN 17 10/20/2021   CREATININE 0.99 10/20/2021   CALCIUM  9.5 10/20/2021   GFR 68.98 10/20/2021   GFRNONAA >60 08/29/2017      Latest Ref Rng & Units 10/20/2021   10:18 AM 08/29/2017   10:59 PM 07/24/2012   10:36 AM  BMP  Glucose 70 - 99 mg/dL 409  811  914   BUN 6 - 23 mg/dL 17  23  15    Creatinine 0.40 - 1.50 mg/dL 7.82  9.56  2.13   Sodium 135 - 145 mEq/L 138  136  140   Potassium 3.5 - 5.1 mEq/L 4.1  3.7  3.9   Chloride 96 - 112 mEq/L 105  103  104   CO2 19 - 32 mEq/L 23  22  27    Calcium 8.4 - 10.5 mg/dL 9.5  9.0  9.5       Latest Ref Rng & Units 10/28/2021    8:18 AM 10/20/2021   10:18 AM 08/29/2017   10:59 PM  CBC  WBC 4.0 - 10.5 K/uL 6.5  5.4  6.2   Hemoglobin 13.0 - 17.0 g/dL 08.6  57.8  46.9   Hematocrit 39.0 - 52.0 % 33.2  33.7  36.3   Platelets 150.0 - 400.0 K/uL 219.0  217.0  211    External Labs:  Labs 11/10/2022:  Total cholesterol 145, triglycerides 90, HDL 56, LDL 72.  Hb 13.9.  Platelets 201.  Serum creatinine 0.950, EGFR 56 mL.  Physical Exam:   VS:  BP 132/88 (BP Location: Left Arm, Patient Position: Sitting, Cuff Size: Normal)   Pulse Marland Kitchen)  51   Resp 16   Ht 5\' 10"  (1.778 m)   Wt 167 lb 9.6 oz (76 kg)   SpO2 96%   BMI 24.05 kg/m    Wt Readings from Last 3 Encounters:  02/01/23 167 lb 9.6 oz (76 kg)  10/28/21 158 lb 6.4 oz (71.8 kg)  10/20/21 160 lb (72.6 kg)     Physical Exam Neck:     Vascular: No carotid bruit or JVD.  Cardiovascular:     Rate and Rhythm: Normal rate. Rhythm irregularly irregular.     Pulses: Intact distal pulses.     Heart sounds: Normal heart sounds. No murmur heard.    No gallop.  Pulmonary:     Effort: Pulmonary effort is normal.     Breath sounds: Normal breath sounds.  Abdominal:     General: Bowel sounds are normal.     Palpations: Abdomen is soft.  Musculoskeletal:     Right lower leg: No edema.     Left lower leg: No edema.     Studies Reviewed: Marland Kitchen    ECHOCARDIOGRAM COMPLETE 10/06/2021  1. Left ventricular ejection fraction, by estimation, is 65 to 70%. The  left ventricle has normal function. The left ventricle has no regional wall motion abnormalities. The left ventricular internal cavity size was severely dilated. Left ventricular diastolic parameters are indeterminate. 2. Right ventricular systolic function is normal. The right ventricular size is normal. There is normal pulmonary artery systolic pressure. 3. Left atrial size was severely dilated. 4. Right atrial size was severely dilated. 5. The mitral valve is normal in structure. Mild mitral valve regurgitation. 6. The aortic valve is tricuspid. Aortic valve regurgitation is mild. 7. Aortic dilatation noted. Aneurysm of the ascending aorta, measuring 47 mm. 8. The inferior vena cava is dilated in size with <50% respiratory variability, suggesting right atrial pressure of 15 mmHg. EKG:   EKG 02/01/2023: Atrial fibrillation with slow ventricular response at the rate of 51 bpm, normal axis, poor R progression, nonspecific T abnormality.  No significant change from 10/28/2021.  Medications and allergies    No Known Allergies   Current Outpatient Medications:    apixaban (ELIQUIS) 5 MG TABS tablet, TAKE 1 TABLET BY MOUTH 2 TIMES A DAY, Disp: 60 tablet, Rfl: 2   atorvastatin (LIPITOR) 40 MG tablet, 1 tablet every day by oral route., Disp: , Rfl:    co-enzyme Q-10 50 MG capsule, Take 50 mg by mouth daily., Disp: , Rfl:    ferrous sulfate 325 (65 FE) MG tablet, Take 1 tablet (325 mg total) by mouth daily with breakfast., Disp: 30 tablet, Rfl: 2   finasteride (PROSCAR) 5 MG tablet, Take 5 mg by mouth daily., Disp: , Rfl:    Flaxseed, Linseed, (FLAX SEED OIL) 1000 MG CAPS, Take 1 capsule by mouth 2 (two) times daily after a meal. , Disp: , Rfl:    fluticasone (FLONASE) 50 MCG/ACT nasal spray, , Disp: , Rfl:    glimepiride (AMARYL) 4 MG tablet, , Disp: , Rfl:    glucose blood test strip, OneTouch Ultra Blue Test Strip, Disp: , Rfl:    hydrocortisone (ANUSOL-HC) 2.5 % rectal cream, Place 1  application rectally 2 (two) times daily as needed for hemorrhoids. , Disp: , Rfl:    insulin aspart (NOVOLOG) 100 UNIT/ML injection, Inject 3 Units into the skin daily before supper., Disp: , Rfl:    insulin detemir (LEVEMIR) 100 UNIT/ML injection, Inject 16 Units into the skin daily., Disp: , Rfl:  irbesartan (AVAPRO) 300 MG tablet, Take 300 mg by mouth every morning., Disp: , Rfl:    Lancets (ONETOUCH DELICA PLUS LANCET33G) MISC, SMARTSIG:1 Topical Daily, Disp: , Rfl:    loratadine (CLARITIN) 10 MG tablet, Take by mouth., Disp: , Rfl:    metFORMIN (GLUCOPHAGE) 500 MG tablet, Take 1,000 mg by mouth 2 (two) times daily with a meal. , Disp: , Rfl:    metoprolol succinate (TOPROL-XL) 25 MG 24 hr tablet, Take 12.5 mg by mouth daily., Disp: , Rfl:    Multiple Vitamin (MULTIVITAMIN) tablet, Take 1 tablet by mouth daily. , Disp: , Rfl:    omeprazole (PRILOSEC) 10 MG capsule, Take 10 mg by mouth daily., Disp: , Rfl:    tamsulosin (FLOMAX) 0.4 MG CAPS capsule, , Disp: , Rfl:    ASSESSMENT AND PLAN: .      ICD-10-CM   1. Permanent atrial fibrillation (HCC)  I48.21 EKG 12-Lead    2. Primary hypertension  I10      Assessment and Plan    Permanent Atrial Fibrillation Well controlled, no symptoms. High risk for stroke (CHADS-VASc score of 4) necessitating anticoagulation. -Continue Eliquis 5mg  twice daily. -In the absence of any symptoms and preserved LVEF, continue rate control strategy only.  Presently on metoprolol succinate 25 mg daily. -Although he has bradycardia, completely asymptomatic.  If heart rate goes below 50, could consider discontinuing metoprolol succinate.  Hypertension Well controlled. -Continue Irbesartan and Metoprolol as currently prescribed. -No clinical evidence of heart failure and BP well controlled  Mild Anemia Stable, no signs of GI bleeding. -Continue current management with iron supplementation. -Advise to seek medical attention if noticing black stools, dark  stools, or blood in stools.  General Health Maintenance -Encourage resumption of regular exercise program once stressors related to moving are resolved. -Follow up with primary care provider, Dr. Dossie Arbour, for ongoing management.   Signed,  Yates Decamp, MD, [facility] 02/01/2023, 9:39 PM [facility] [address] [place], [place] [address] Phone: [phone]. Fax:  [phone]

## 2023-02-22 DIAGNOSIS — M25511 Pain in right shoulder: Secondary | ICD-10-CM | POA: Diagnosis not present

## 2023-02-22 DIAGNOSIS — M7531 Calcific tendinitis of right shoulder: Secondary | ICD-10-CM | POA: Diagnosis not present

## 2023-03-12 ENCOUNTER — Other Ambulatory Visit (HOSPITAL_COMMUNITY): Payer: Self-pay | Admitting: Physician Assistant

## 2023-03-12 NOTE — Telephone Encounter (Signed)
Prescription refill request for Eliquis received. Indication:afib Last office visit:12/24 Scr:0.95  9/24 Age: 89 Weight:76  kg  Prescription refilled

## 2023-04-12 DIAGNOSIS — I4891 Unspecified atrial fibrillation: Secondary | ICD-10-CM | POA: Diagnosis not present

## 2023-04-12 DIAGNOSIS — I739 Peripheral vascular disease, unspecified: Secondary | ICD-10-CM | POA: Diagnosis not present

## 2023-04-12 DIAGNOSIS — M5416 Radiculopathy, lumbar region: Secondary | ICD-10-CM | POA: Diagnosis not present

## 2023-04-12 DIAGNOSIS — E1151 Type 2 diabetes mellitus with diabetic peripheral angiopathy without gangrene: Secondary | ICD-10-CM | POA: Diagnosis not present

## 2023-04-12 DIAGNOSIS — I10 Essential (primary) hypertension: Secondary | ICD-10-CM | POA: Diagnosis not present

## 2023-05-03 DIAGNOSIS — J069 Acute upper respiratory infection, unspecified: Secondary | ICD-10-CM | POA: Diagnosis not present

## 2023-05-03 DIAGNOSIS — R058 Other specified cough: Secondary | ICD-10-CM | POA: Diagnosis not present

## 2023-05-23 DIAGNOSIS — E785 Hyperlipidemia, unspecified: Secondary | ICD-10-CM | POA: Diagnosis not present

## 2023-05-23 DIAGNOSIS — I10 Essential (primary) hypertension: Secondary | ICD-10-CM | POA: Diagnosis not present

## 2023-05-23 DIAGNOSIS — E1151 Type 2 diabetes mellitus with diabetic peripheral angiopathy without gangrene: Secondary | ICD-10-CM | POA: Diagnosis not present

## 2023-05-30 DIAGNOSIS — E1151 Type 2 diabetes mellitus with diabetic peripheral angiopathy without gangrene: Secondary | ICD-10-CM | POA: Diagnosis not present

## 2023-06-25 DIAGNOSIS — L812 Freckles: Secondary | ICD-10-CM | POA: Diagnosis not present

## 2023-06-25 DIAGNOSIS — L570 Actinic keratosis: Secondary | ICD-10-CM | POA: Diagnosis not present

## 2023-06-25 DIAGNOSIS — D1801 Hemangioma of skin and subcutaneous tissue: Secondary | ICD-10-CM | POA: Diagnosis not present

## 2023-06-25 DIAGNOSIS — D485 Neoplasm of uncertain behavior of skin: Secondary | ICD-10-CM | POA: Diagnosis not present

## 2023-06-25 DIAGNOSIS — L821 Other seborrheic keratosis: Secondary | ICD-10-CM | POA: Diagnosis not present

## 2023-06-25 DIAGNOSIS — Z85828 Personal history of other malignant neoplasm of skin: Secondary | ICD-10-CM | POA: Diagnosis not present

## 2023-06-25 DIAGNOSIS — C44319 Basal cell carcinoma of skin of other parts of face: Secondary | ICD-10-CM | POA: Diagnosis not present

## 2023-06-29 DIAGNOSIS — E1151 Type 2 diabetes mellitus with diabetic peripheral angiopathy without gangrene: Secondary | ICD-10-CM | POA: Diagnosis not present

## 2023-06-29 DIAGNOSIS — K4090 Unilateral inguinal hernia, without obstruction or gangrene, not specified as recurrent: Secondary | ICD-10-CM | POA: Diagnosis not present

## 2023-07-03 DIAGNOSIS — I10 Essential (primary) hypertension: Secondary | ICD-10-CM | POA: Diagnosis not present

## 2023-07-03 DIAGNOSIS — E1151 Type 2 diabetes mellitus with diabetic peripheral angiopathy without gangrene: Secondary | ICD-10-CM | POA: Diagnosis not present

## 2023-07-03 DIAGNOSIS — E785 Hyperlipidemia, unspecified: Secondary | ICD-10-CM | POA: Diagnosis not present

## 2023-07-03 DIAGNOSIS — Z794 Long term (current) use of insulin: Secondary | ICD-10-CM | POA: Diagnosis not present

## 2023-07-30 DIAGNOSIS — E1151 Type 2 diabetes mellitus with diabetic peripheral angiopathy without gangrene: Secondary | ICD-10-CM | POA: Diagnosis not present

## 2023-08-02 DIAGNOSIS — Z85828 Personal history of other malignant neoplasm of skin: Secondary | ICD-10-CM | POA: Diagnosis not present

## 2023-08-02 DIAGNOSIS — C44319 Basal cell carcinoma of skin of other parts of face: Secondary | ICD-10-CM | POA: Diagnosis not present

## 2023-08-29 DIAGNOSIS — E1151 Type 2 diabetes mellitus with diabetic peripheral angiopathy without gangrene: Secondary | ICD-10-CM | POA: Diagnosis not present

## 2023-09-20 DIAGNOSIS — M25511 Pain in right shoulder: Secondary | ICD-10-CM | POA: Diagnosis not present

## 2023-09-20 DIAGNOSIS — M7541 Impingement syndrome of right shoulder: Secondary | ICD-10-CM | POA: Diagnosis not present

## 2023-09-20 DIAGNOSIS — M5441 Lumbago with sciatica, right side: Secondary | ICD-10-CM | POA: Diagnosis not present

## 2023-09-25 DIAGNOSIS — M5441 Lumbago with sciatica, right side: Secondary | ICD-10-CM | POA: Diagnosis not present

## 2023-09-25 DIAGNOSIS — M7541 Impingement syndrome of right shoulder: Secondary | ICD-10-CM | POA: Diagnosis not present

## 2023-09-25 DIAGNOSIS — M25511 Pain in right shoulder: Secondary | ICD-10-CM | POA: Diagnosis not present

## 2023-09-27 DIAGNOSIS — M25511 Pain in right shoulder: Secondary | ICD-10-CM | POA: Diagnosis not present

## 2023-09-27 DIAGNOSIS — M7541 Impingement syndrome of right shoulder: Secondary | ICD-10-CM | POA: Diagnosis not present

## 2023-09-27 DIAGNOSIS — M5441 Lumbago with sciatica, right side: Secondary | ICD-10-CM | POA: Diagnosis not present

## 2023-09-28 DIAGNOSIS — M5459 Other low back pain: Secondary | ICD-10-CM | POA: Diagnosis not present

## 2023-09-29 DIAGNOSIS — E1151 Type 2 diabetes mellitus with diabetic peripheral angiopathy without gangrene: Secondary | ICD-10-CM | POA: Diagnosis not present

## 2023-10-01 ENCOUNTER — Telehealth: Payer: Self-pay

## 2023-10-01 NOTE — Telephone Encounter (Signed)
   Pre-operative Risk Assessment    Patient Name: Kevin Cole  DOB: 02-24-1933 MRN: [id number]   Date of last office visit: 02/01/23 GORDY BERGAMO, MD Date of next office visit: NONE   Request for Surgical Clearance    Procedure:  EPIDURAL STEROID INJECTION, LUMBAR (PROC) - RIGHT  L5-S1   Date of Surgery:  Clearance TBD                                Surgeon:  DR LAQUETA Surgeon's Group or Practice Name:  [facility] Phone number:  [phone] Fax number:  [phone]   Type of Clearance Requested:   - Medical  - Pharmacy:  Hold Apixaban  (Eliquis ) 3 DAYS PRIOR   Type of Anesthesia:  Not Indicated   Additional requests/questions:    Signed, Lucie DELENA Ku   10/01/2023, 1:45 PM

## 2023-10-04 DIAGNOSIS — M7541 Impingement syndrome of right shoulder: Secondary | ICD-10-CM | POA: Diagnosis not present

## 2023-10-04 DIAGNOSIS — E1151 Type 2 diabetes mellitus with diabetic peripheral angiopathy without gangrene: Secondary | ICD-10-CM | POA: Diagnosis not present

## 2023-10-04 DIAGNOSIS — Z794 Long term (current) use of insulin: Secondary | ICD-10-CM | POA: Diagnosis not present

## 2023-10-04 DIAGNOSIS — I10 Essential (primary) hypertension: Secondary | ICD-10-CM | POA: Diagnosis not present

## 2023-10-04 DIAGNOSIS — M5441 Lumbago with sciatica, right side: Secondary | ICD-10-CM | POA: Diagnosis not present

## 2023-10-04 DIAGNOSIS — E785 Hyperlipidemia, unspecified: Secondary | ICD-10-CM | POA: Diagnosis not present

## 2023-10-04 DIAGNOSIS — M25511 Pain in right shoulder: Secondary | ICD-10-CM | POA: Diagnosis not present

## 2023-10-05 NOTE — Telephone Encounter (Signed)
 Pt requesting a nurse call [facility] with a update

## 2023-10-08 NOTE — Telephone Encounter (Signed)
 Pt calling to f/u on Clearance and whether he needs in office visit.

## 2023-10-08 NOTE — Telephone Encounter (Signed)
**Note De-identified  [place] Obfuscation** Please advise 

## 2023-10-09 NOTE — Telephone Encounter (Signed)
 Pt calling back to check on Clearance status. Pt would like a c/b once Eliquis  has been cleared and can be scheduled for virtual visit.

## 2023-10-09 NOTE — Telephone Encounter (Signed)
 Spoke with pt to give him an update on his preop clearance. Pt was advise that once we hear back from our Pharmacist, our office will give him a call.

## 2023-10-10 ENCOUNTER — Telehealth: Payer: Self-pay

## 2023-10-10 DIAGNOSIS — M7541 Impingement syndrome of right shoulder: Secondary | ICD-10-CM | POA: Diagnosis not present

## 2023-10-10 DIAGNOSIS — M25511 Pain in right shoulder: Secondary | ICD-10-CM | POA: Diagnosis not present

## 2023-10-10 DIAGNOSIS — M5441 Lumbago with sciatica, right side: Secondary | ICD-10-CM | POA: Diagnosis not present

## 2023-10-10 NOTE — Telephone Encounter (Signed)
 Patient with diagnosis of afib on Eliquis  for anticoagulation.    Procedure: EPIDURAL STEROID INJECTION, LUMBAR (PROC) - RIGHT L5-S1  Date of procedure: TBD   CHA2DS2-VASc Score = 4   This indicates a 4.8% annual risk of stroke. The patient's score is based upon: CHF History: 0 HTN History: 1 Diabetes History: 1 Stroke History: 0 Vascular Disease History: 0 Age Score: 2 Gender Score: 0      CrCl 55 ml/min Platelet count 201  Patient has not had an Afib/aflutter ablation within the last 3 months or DCCV within the last 30 days  Per office protocol, patient can hold Eliquis  for 3 days prior to procedure.    **This guidance is not considered finalized until pre-operative APP has relayed final recommendations.**

## 2023-10-10 NOTE — Telephone Encounter (Signed)
   Name: Kevin Cole  DOB: 01/25/1934  MRN: [id number]  Primary Cardiologist: Gordy Bergamo, MD   Preoperative team, please contact this patient and set up a phone call appointment for further preoperative risk assessment. Please obtain consent and complete medication review. Thank you for your help.  I confirm that guidance regarding antiplatelet and oral anticoagulation therapy has been completed and, if necessary, noted below.  I also confirmed the patient resides in the state of [place] . As per [facility] Medical Board telemedicine laws, the patient must reside in the state in which the provider is licensed.   Jon Nat Hails, PA 10/10/2023, 3:11 PM [facility]

## 2023-10-10 NOTE — Telephone Encounter (Signed)
 Preop tele appt now scheduled, med rec and consent done.

## 2023-10-10 NOTE — Telephone Encounter (Signed)
 1st attempt to reach pt regarding surgical clearance and the need for an TELE appointment.  Left pt a detailed message to call back and get that scheduled.

## 2023-10-10 NOTE — Telephone Encounter (Signed)
  Patient Consent for Virtual Visit        Kevin Cole has provided verbal consent on 10/10/2023 for a virtual visit (video or telephone).   CONSENT FOR VIRTUAL VISIT FOR:  Kevin Cole  By participating in this virtual visit I agree to the following:  I hereby voluntarily request, consent and authorize [facility] and its employed or contracted physicians, physician assistants, nurse practitioners or other licensed health care professionals (the Practitioner), to provide me with telemedicine health care services (the "Services) as deemed necessary by the treating Practitioner. I acknowledge and consent to receive the Services by the Practitioner via telemedicine. I understand that the telemedicine visit will involve communicating with the Practitioner through live audiovisual communication technology and the disclosure of certain medical information by electronic transmission. I acknowledge that I have been given the opportunity to request an in-person assessment or other available alternative prior to the telemedicine visit and am voluntarily participating in the telemedicine visit.  I understand that I have the right to withhold or withdraw my consent to the use of telemedicine in the course of my care at any time, without affecting my right to future care or treatment, and that the Practitioner or I may terminate the telemedicine visit at any time. I understand that I have the right to inspect all information obtained and/or recorded in the course of the telemedicine visit and may receive copies of available information for a reasonable fee.  I understand that some of the potential risks of receiving the Services via telemedicine include:  Delay or interruption in medical evaluation due to technological equipment failure or disruption; Information transmitted may not be sufficient (e.g. poor resolution of images) to allow for appropriate medical decision making by the  Practitioner; and/or  In rare instances, security protocols could fail, causing a breach of personal health information.  Furthermore, I acknowledge that it is my responsibility to provide information about my medical history, conditions and care that is complete and accurate to the best of my ability. I acknowledge that Practitioner's advice, recommendations, and/or decision may be based on factors not within their control, such as incomplete or inaccurate data provided by me or distortions of diagnostic images or specimens that may result from electronic transmissions. I understand that the practice of medicine is not an exact science and that Practitioner makes no warranties or guarantees regarding treatment outcomes. I acknowledge that a copy of this consent can be made available to me via my patient portal [facility] MyChart), or I can request a printed copy by calling the office of [facility].    I understand that my insurance will be billed for this visit.   I have read or had this consent read to me. I understand the contents of this consent, which adequately explains the benefits and risks of the Services being provided via telemedicine.  I have been provided ample opportunity to ask questions regarding this consent and the Services and have had my questions answered to my satisfaction. I give my informed consent for the services to be provided through the use of telemedicine in my medical care

## 2023-10-12 DIAGNOSIS — M7541 Impingement syndrome of right shoulder: Secondary | ICD-10-CM | POA: Diagnosis not present

## 2023-10-12 DIAGNOSIS — M5441 Lumbago with sciatica, right side: Secondary | ICD-10-CM | POA: Diagnosis not present

## 2023-10-12 DIAGNOSIS — M25511 Pain in right shoulder: Secondary | ICD-10-CM | POA: Diagnosis not present

## 2023-10-16 DIAGNOSIS — M25511 Pain in right shoulder: Secondary | ICD-10-CM | POA: Diagnosis not present

## 2023-10-16 DIAGNOSIS — M7541 Impingement syndrome of right shoulder: Secondary | ICD-10-CM | POA: Diagnosis not present

## 2023-10-16 DIAGNOSIS — M5441 Lumbago with sciatica, right side: Secondary | ICD-10-CM | POA: Diagnosis not present

## 2023-10-19 ENCOUNTER — Ambulatory Visit: Attending: Internal Medicine | Admitting: Student

## 2023-10-19 DIAGNOSIS — Z01810 Encounter for preprocedural cardiovascular examination: Secondary | ICD-10-CM | POA: Diagnosis not present

## 2023-10-19 NOTE — Progress Notes (Signed)
 Virtual Visit via Telephone Note   Because of Kevin Cole co-morbid illnesses, he is at least at moderate risk for complications without adequate follow up.  This format is felt to be most appropriate for this patient at this time.  The patient did not have access to video technology/had technical difficulties with video requiring transitioning to audio format only (telephone).  All issues noted in this document were discussed and addressed.  No physical exam could be performed with this format.  Please refer to the patient's chart for his consent to telehealth for [facility].  Evaluation Performed:  Preoperative cardiovascular risk assessment _____________   Date:  10/19/2023   Patient ID:  Kevin Cole, DOB [birth year], MRN [id number] Patient Location:  Home Provider location:   Office  Primary Care Provider:  Yolande Toribio MATSU, MD Primary Cardiologist:  Kevin Bergamo, MD  Chief Complaint / Patient Profile   89 y.o. y/o male with a h/o permanent A-fib on anticoagulation, hypertension hyperlipidemia, GERD, T2DM who is pending epidural steroid injection right L5-S1 by Kevin Cole and presents today for telephonic preoperative cardiovascular risk assessment.  History of Present Illness    Kevin Cole is a 89 y.o. male who presents via audio/video conferencing for a telehealth visit today.  Pt was last seen in cardiology clinic on 02/01/2023 by Dr. Bergamo.  At that time Kevin Cole was stable from a cardiac standpoint.  The patient is now pending procedure as outlined above. Since his last visit, he is doing well. Patient denies shortness of breath, dyspnea on exertion, lower extremity edema, orthopnea or PND. No chest pain, pressure, or tightness. No palpitations.  He is not experiencing lightheadedness, dizziness, presyncope or syncope. He was very active until about a month ago when he had a flare of back pain. He was walking 1.5 miles 3 days a week. He is now  participating in physical therapy 2 times a week.   Past Medical History    Past Medical History:  Diagnosis Date   Allergic rhinitis    Arthritis    LUMBAR - RECENT PHYSICAL THERAPY -WAS HAVING LOWER BACK PAIN   Atrial fibrillation (HCC)    Cardiac arrhythmia due to congenital heart disease    Colon polyps    type unknown   Diabetes (HCC)    Diabetes mellitus, type 2 (HCC)    GERD (gastroesophageal reflux disease)    GERD (gastroesophageal reflux disease)    Hemorrhoids    History of kidney stones    Hyperlipidemia    Hypertension    Kidney stones    Past Surgical History:  Procedure Laterality Date   CYSTO FOR BLADDER STONE     CYSTOSCOPY WITH RETROGRADE PYELOGRAM, URETEROSCOPY AND STENT PLACEMENT Left 07/29/2012   Procedure: CYSTOSCOPY WITH LEFT RETROGRADE PYELOGRAM, LEFT URETEROSCOPY AND STENT PLACEMENT;  Surgeon: Kevin C Ottelin, MD;  Location: [facility];  Service: Urology;  Laterality: Left;   HOLMIUM LASER APPLICATION Left 07/29/2012   Procedure: HOLMIUM LASER APPLICATION with basket retrival of stone;  Surgeon: Kevin JAYSON Rafter, MD;  Location: [facility];  Service: Urology;  Laterality: Left;    Allergies  No Known Allergies  Home Medications    Prior to Admission medications   Medication Sig Start Date End Date Taking? Authorizing Provider  apixaban  (ELIQUIS ) 5 MG TABS tablet TAKE 1 TABLET BY MOUTH 2 TIMES A DAY 03/12/23   Fenton, Clint R, PA  atorvastatin (LIPITOR) 40 MG tablet 1 tablet every day by  oral route.    [provider]  co-enzyme Q-10 50 MG capsule Take 50 mg by mouth daily.    [provider]  ferrous sulfate  325 (65 FE) MG tablet Take 1 tablet (325 mg total) by mouth daily with breakfast. 10/21/21   Kennedy-Smith, Colleen M, NP  finasteride (PROSCAR) 5 MG tablet Take 5 mg by mouth daily.    [provider]  Flaxseed, Linseed, (FLAX SEED OIL) 1000 MG CAPS Take 1 capsule by mouth 2 (two) times daily after a meal.     [provider]  fluticasone OREN) 50 MCG/ACT nasal spray  09/17/18   [provider]  glimepiride (AMARYL) 4 MG tablet  08/11/21   [provider]  glucose blood test strip OneTouch Ultra Blue Test Strip    [provider]  hydrocortisone (ANUSOL-HC) 2.5 % rectal cream Place 1 application rectally 2 (two) times daily as needed for hemorrhoids.     [provider]  insulin aspart (NOVOLOG) 100 UNIT/ML injection Inject 3 Units into the skin daily before supper.    [provider]  insulin detemir (LEVEMIR) 100 UNIT/ML injection Inject 16 Units into the skin daily.    [provider]  irbesartan (AVAPRO) 300 MG tablet Take 300 mg by mouth every morning.    [provider]  Lancets (ONETOUCH DELICA PLUS [place]) MISC SMARTSIG:1 Topical Daily 09/20/21   [provider]  loratadine (CLARITIN) 10 MG tablet Take by mouth.    [provider]  metFORMIN (GLUCOPHAGE) 500 MG tablet Take 1,000 mg by mouth 2 (two) times daily with a meal.     [provider]  metoprolol succinate (TOPROL-XL) 25 MG 24 hr tablet Take 12.5 mg by mouth daily. 08/12/21   [provider]  Multiple Vitamin (MULTIVITAMIN) tablet Take 1 tablet by mouth daily.     [provider]  omeprazole (PRILOSEC) 10 MG capsule Take 10 mg by mouth daily.    [provider]  tamsulosin (FLOMAX) 0.4 MG CAPS capsule  07/31/21   [provider]    Physical Exam    Vital Signs:  Kevin Cole does not have vital signs available for review today.  Given telephonic nature of communication, physical exam is limited. AAOx3. NAD. Normal affect.  Speech and respirations are unlabored.   Assessment & Plan    Primary Cardiologist: Kevin Bergamo, MD  Preoperative cardiovascular risk assessment.  Epidural steroid injection right L5-S1 by Kevin Cole.  Chart reviewed as part of pre-operative protocol coverage. According to the RCRI, patient  has a 0.9% risk of MACE. Patient reports activity equivalent to 4.0 METS (PT 2 times a week).   Given past medical history and time since last visit, based on ACC/AHA guidelines, Kevin Cole would be at acceptable risk for the planned procedure without further cardiovascular testing.   Patient was advised that if he develops new symptoms prior to surgery to contact our office to arrange a follow-up appointment.  he verbalized understanding.  Per Pharm D, patient has not had an Afib/aflutter ablation within the last 3 months or DCCV within the last 30 days. Patient may hold Eliquis  for 3 days prior to procedure.   I will route this recommendation to the requesting party via Epic fax function.  Please call with questions.  Time:   Today, I have spent 5 minutes with the patient with telehealth technology discussing medical history, symptoms, and management plan.     Barnie Hila, NP  10/19/2023, 8:06 AM

## 2023-10-30 DIAGNOSIS — E1151 Type 2 diabetes mellitus with diabetic peripheral angiopathy without gangrene: Secondary | ICD-10-CM | POA: Diagnosis not present

## 2023-10-30 DIAGNOSIS — M5416 Radiculopathy, lumbar region: Secondary | ICD-10-CM | POA: Diagnosis not present

## 2023-11-05 DIAGNOSIS — Z961 Presence of intraocular lens: Secondary | ICD-10-CM | POA: Diagnosis not present

## 2023-11-05 DIAGNOSIS — H348322 Tributary (branch) retinal vein occlusion, left eye, stable: Secondary | ICD-10-CM | POA: Diagnosis not present

## 2023-11-05 DIAGNOSIS — E119 Type 2 diabetes mellitus without complications: Secondary | ICD-10-CM | POA: Diagnosis not present

## 2023-11-09 DIAGNOSIS — M7541 Impingement syndrome of right shoulder: Secondary | ICD-10-CM | POA: Diagnosis not present

## 2023-11-09 DIAGNOSIS — M5441 Lumbago with sciatica, right side: Secondary | ICD-10-CM | POA: Diagnosis not present

## 2023-11-09 DIAGNOSIS — M25511 Pain in right shoulder: Secondary | ICD-10-CM | POA: Diagnosis not present

## 2023-11-16 DIAGNOSIS — M47816 Spondylosis without myelopathy or radiculopathy, lumbar region: Secondary | ICD-10-CM | POA: Diagnosis not present

## 2023-11-16 DIAGNOSIS — M7061 Trochanteric bursitis, right hip: Secondary | ICD-10-CM | POA: Diagnosis not present

## 2023-11-16 DIAGNOSIS — M5416 Radiculopathy, lumbar region: Secondary | ICD-10-CM | POA: Diagnosis not present

## 2023-11-16 DIAGNOSIS — M47896 Other spondylosis, lumbar region: Secondary | ICD-10-CM | POA: Diagnosis not present

## 2023-11-16 DIAGNOSIS — E1140 Type 2 diabetes mellitus with diabetic neuropathy, unspecified: Secondary | ICD-10-CM | POA: Diagnosis not present

## 2023-11-22 DIAGNOSIS — I10 Essential (primary) hypertension: Secondary | ICD-10-CM | POA: Diagnosis not present

## 2023-11-22 DIAGNOSIS — D509 Iron deficiency anemia, unspecified: Secondary | ICD-10-CM | POA: Diagnosis not present

## 2023-11-22 DIAGNOSIS — E1151 Type 2 diabetes mellitus with diabetic peripheral angiopathy without gangrene: Secondary | ICD-10-CM | POA: Diagnosis not present

## 2023-11-22 DIAGNOSIS — Z125 Encounter for screening for malignant neoplasm of prostate: Secondary | ICD-10-CM | POA: Diagnosis not present

## 2023-11-22 DIAGNOSIS — E785 Hyperlipidemia, unspecified: Secondary | ICD-10-CM | POA: Diagnosis not present

## 2023-11-26 DIAGNOSIS — R972 Elevated prostate specific antigen [PSA]: Secondary | ICD-10-CM | POA: Diagnosis not present

## 2023-11-29 DIAGNOSIS — R82998 Other abnormal findings in urine: Secondary | ICD-10-CM | POA: Diagnosis not present

## 2023-11-29 DIAGNOSIS — E1151 Type 2 diabetes mellitus with diabetic peripheral angiopathy without gangrene: Secondary | ICD-10-CM | POA: Diagnosis not present

## 2023-11-29 DIAGNOSIS — Z1339 Encounter for screening examination for other mental health and behavioral disorders: Secondary | ICD-10-CM | POA: Diagnosis not present

## 2023-11-29 DIAGNOSIS — Z1331 Encounter for screening for depression: Secondary | ICD-10-CM | POA: Diagnosis not present

## 2023-11-29 DIAGNOSIS — E785 Hyperlipidemia, unspecified: Secondary | ICD-10-CM | POA: Diagnosis not present

## 2023-11-29 DIAGNOSIS — M19042 Primary osteoarthritis, left hand: Secondary | ICD-10-CM | POA: Diagnosis not present

## 2023-11-29 DIAGNOSIS — Z0000 Encounter for general adult medical examination without abnormal findings: Secondary | ICD-10-CM | POA: Diagnosis not present

## 2023-11-29 DIAGNOSIS — I4891 Unspecified atrial fibrillation: Secondary | ICD-10-CM | POA: Diagnosis not present

## 2023-11-29 DIAGNOSIS — Z23 Encounter for immunization: Secondary | ICD-10-CM | POA: Diagnosis not present

## 2023-11-29 DIAGNOSIS — I739 Peripheral vascular disease, unspecified: Secondary | ICD-10-CM | POA: Diagnosis not present

## 2023-11-29 DIAGNOSIS — M19041 Primary osteoarthritis, right hand: Secondary | ICD-10-CM | POA: Diagnosis not present

## 2023-11-29 DIAGNOSIS — D6869 Other thrombophilia: Secondary | ICD-10-CM | POA: Diagnosis not present

## 2023-11-29 DIAGNOSIS — R972 Elevated prostate specific antigen [PSA]: Secondary | ICD-10-CM | POA: Diagnosis not present

## 2023-12-03 DIAGNOSIS — N200 Calculus of kidney: Secondary | ICD-10-CM | POA: Diagnosis not present

## 2023-12-03 DIAGNOSIS — R399 Unspecified symptoms and signs involving the genitourinary system: Secondary | ICD-10-CM | POA: Diagnosis not present

## 2023-12-03 DIAGNOSIS — R3912 Poor urinary stream: Secondary | ICD-10-CM | POA: Diagnosis not present

## 2023-12-05 ENCOUNTER — Other Ambulatory Visit (HOSPITAL_COMMUNITY): Payer: Self-pay | Admitting: Physician Assistant

## 2023-12-11 DIAGNOSIS — M7061 Trochanteric bursitis, right hip: Secondary | ICD-10-CM | POA: Diagnosis not present

## 2023-12-30 DIAGNOSIS — E1151 Type 2 diabetes mellitus with diabetic peripheral angiopathy without gangrene: Secondary | ICD-10-CM | POA: Diagnosis not present
